# Patient Record
Sex: Female | Born: 1990 | Race: Black or African American | Hispanic: No | Marital: Single | Smoking: Never smoker
Health system: Southern US, Community
[De-identification: ages and names within clinical notes are randomized; demographics above are authoritative.]

## PROBLEM LIST (undated history)

## (undated) DIAGNOSIS — Z789 Other specified health status: Secondary | ICD-10-CM

## (undated) DIAGNOSIS — D649 Anemia, unspecified: Secondary | ICD-10-CM

## (undated) HISTORY — DX: Other specified health status: Z78.9

---
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
## 2011-03-01 ENCOUNTER — Encounter: Payer: Self-pay | Admitting: Emergency Medicine

## 2011-03-01 ENCOUNTER — Emergency Department (INDEPENDENT_AMBULATORY_CARE_PROVIDER_SITE_OTHER)
Admission: EM | Admit: 2011-03-01 | Discharge: 2011-03-01 | Disposition: A | Discharge status: Home / Self Care | Payer: Medicaid Other | Source: Home / Self Care

## 2011-03-01 DIAGNOSIS — T148XXA Other injury of unspecified body region, initial encounter: Secondary | ICD-10-CM

## 2011-03-01 DIAGNOSIS — W57XXXA Bitten or stung by nonvenomous insect and other nonvenomous arthropods, initial encounter: Secondary | ICD-10-CM

## 2011-03-01 DIAGNOSIS — R0781 Pleurodynia: Secondary | ICD-10-CM

## 2011-03-01 DIAGNOSIS — R079 Chest pain, unspecified: Secondary | ICD-10-CM

## 2011-03-01 DIAGNOSIS — T148 Other injury of unspecified body region: Secondary | ICD-10-CM

## 2011-03-01 MED ORDER — IBUPROFEN 800 MG PO TABS: 800.0000 mg | ORAL_TABLET | Freq: Three times a day (TID) | ORAL | Status: AC

## 2011-03-01 NOTE — ED Notes (Signed)
Multiple complaints.  Left mid back pain onset 2 weeks ago.  Pain with movement.  No known injury.   Patient concerned with bumps on right arm, right thigh, right side, right side of neck.  These areas onset 2 weeks ago, thought to be mosquito bites per patient, itch bad, areas she points to are differently colored than skin, side is somewhat reddish, arm is darker hue.

## 2011-03-01 NOTE — ED Provider Notes (Signed)
History     CSN: [id number]  Arrival date & time 03/01/11  4098   None     Chief Complaint  Patient presents with  . Back Pain    (Consider location/radiation/quality/duration/timing/severity/associated sxs/prior treatment) HPI Comments: Lt mid lateral back pain with twisting movement to Lt only x 2 weeks. Denies trauma. No pain unless does the twisting movment. Took "something" a couple of days ago that her mother gave her, thinks it may have been a laxative as her parents thought it might be gas causing her discomfort. No chest discomfort, cough, dyspnea, abd pain, nausea or vomiting. Pt also states that she has itchy bumps x 2 weeks. She first noticed one on her Lt forearm. Then one on her neck, Lt hip area and Lt thigh. They were red and raised but now are flat and fading. She has not been putting anything on them.   The history is provided by the patient.    History reviewed. No pertinent past medical history.  History reviewed. No pertinent past surgical history.  History reviewed. No pertinent family history.  History  Substance Use Topics  . Smoking status: Never Smoker   . Smokeless tobacco: Not on file  . Alcohol Use: No    OB History    Grav Para Term Preterm Abortions TAB SAB Ect Mult Living                  Review of Systems  Constitutional: Negative for fever and chills.  Respiratory: Negative for cough and shortness of breath.   Cardiovascular: Negative for chest pain.  Gastrointestinal: Negative for nausea, vomiting, abdominal pain and diarrhea.  Musculoskeletal: Positive for back pain.  Skin: Positive for rash.    Allergies  Review of patient's allergies indicates no known allergies.  Home Medications   Current Outpatient Rx  Name Route Sig Dispense Refill  . OVER THE COUNTER MEDICATION       . IBUPROFEN 800 MG PO TABS Oral Take 1 tablet (800 mg total) by mouth 3 (three) times daily. 15 tablet 0    BP 102/60  Pulse 85  Temp(Src) 98.7 F  (37.1 C) (Oral)  Resp 14  SpO2 100%  LMP 02/16/2011  Physical Exam  Nursing note and vitals reviewed. Constitutional: She is oriented to person, place, and time. She appears well-developed and well-nourished. No distress.  Cardiovascular: Normal rate, regular rhythm and normal heart sounds.   Pulmonary/Chest: Effort normal and breath sounds normal. No respiratory distress. She exhibits no tenderness.  Abdominal: Soft. Bowel sounds are normal. She exhibits no distension and no mass. There is no tenderness.  Musculoskeletal:       Thoracic back: She exhibits bony tenderness. She exhibits normal range of motion, no tenderness, no swelling, no edema and no spasm.       Lumbar back: Normal.       Back:  Neurological: She is alert and oriented to person, place, and time.  Skin: Skin is warm and dry. Rash noted. Rash is macular and papular.       Lt superior neck 2 mm mildly erythematous papule, no pustule or vesicle. Lt forearm 1 cm macular brownish hyperpigmented area. Lt superior iliac crest area 1.2 cm brownish hyperpigmented macular area with central nonerythematous nodule 5 mm. Lt anterior mid thigh 4 mm mildly erythematous papule, no pustule or vesicle.   Psychiatric: She has a normal mood and affect.    ED Course  Procedures (including critical care time)  Labs Reviewed -  No data to display No results found.   1. Rib pain   2. Insect bites       MDM  Lt lateral rib pain, pain only with Lt rotation movement. No trauma. Resolving itchy papules - suspect insect bites.         Melody Comas, [place] 03/01/11 [phone]

## 2011-03-06 NOTE — ED Provider Notes (Signed)
Medical screening examination/treatment/procedure(s) were performed by non-physician practitioner and as supervising physician I was immediately available for consultation/collaboration.  Corrie Mckusick, MD 03/06/11 1659

## 2017-12-08 ENCOUNTER — Ambulatory Visit (HOSPITAL_COMMUNITY)
Admission: EM | Admit: 2017-12-08 | Discharge: 2017-12-08 | Disposition: A | Discharge status: Home / Self Care | Payer: Self-pay | Attending: Internal Medicine | Admitting: Internal Medicine

## 2017-12-08 ENCOUNTER — Encounter (HOSPITAL_COMMUNITY): Payer: Self-pay | Admitting: Emergency Medicine

## 2017-12-08 DIAGNOSIS — A749 Chlamydial infection, unspecified: Secondary | ICD-10-CM

## 2017-12-08 DIAGNOSIS — A549 Gonococcal infection, unspecified: Secondary | ICD-10-CM

## 2017-12-08 MED ORDER — AZITHROMYCIN 250 MG PO TABS
ORAL_TABLET | ORAL | Status: AC
  Filled 2017-12-08: qty 4

## 2017-12-08 MED ORDER — CEFTRIAXONE SODIUM 250 MG IJ SOLR
250.0000 mg | Freq: Once | INTRAMUSCULAR | Status: AC
  Administered 2017-12-08: 250 mg via INTRAMUSCULAR

## 2017-12-08 MED ORDER — CEFTRIAXONE SODIUM 250 MG IJ SOLR
INTRAMUSCULAR | Status: AC
  Filled 2017-12-08: qty 250

## 2017-12-08 MED ORDER — STERILE WATER FOR INJECTION IJ SOLN
INTRAMUSCULAR | Status: AC
  Filled 2017-12-08: qty 10

## 2017-12-08 MED ORDER — AZITHROMYCIN 250 MG PO TABS
1000.0000 mg | ORAL_TABLET | Freq: Once | ORAL | Status: AC
  Administered 2017-12-08: 1000 mg via ORAL

## 2017-12-08 NOTE — ED Triage Notes (Signed)
Pt sts tested positive last week for [facility] and here for treatment; pt sts tested at health department in [facility]

## 2017-12-08 NOTE — Discharge Instructions (Signed)
We have treated you today for gonorrhea and chlamydia.  Please withhold from intercourse for the next week. Please use condoms to prevent STD's.

## 2017-12-08 NOTE — ED Provider Notes (Signed)
[facility]    CSN: [id number] Arrival date & time: 12/08/17  1016     History   Chief Complaint Chief Complaint  Patient presents with  . Exposure to STD    HPI Linda Holmes is a 27 y.o. female.   Jil presents with requests for std treatment. States her partner notified her that he tested positive and recommended she get screened. She was tested at the health department in [facility] three days ago and was notified that she was positive for both gonorrhea and chlamydia, therefore she is here for treatment. States has had some vaginal itching. No pelvic pain, fevers. No obvious discharge. No back pain. States did have some spotting which has resolved. LMP 9/9, has not missed a period. Not on birth control. States she has had STD's in the past. Only has the 1 partner, doesn't regularly use condoms.     ROS per HPI.       History reviewed. No pertinent past medical history.  There are no active problems to display for this patient.   History reviewed. No pertinent surgical history.  OB History   None      Home Medications    Prior to Admission medications   Medication Sig Start Date End Date Taking? Authorizing Provider  OVER THE COUNTER MEDICATION      [provider]    Family History History reviewed. No pertinent family history.  Social History Social History   Tobacco Use  . Smoking status: Never Smoker  Substance Use Topics  . Alcohol use: No  . Drug use: Not on file     Allergies   Patient has no known allergies.   Review of Systems Review of Systems   Physical Exam Triage Vital Signs ED Triage Vitals [12/08/17 1059]  Enc Vitals Group     BP 132/81     Pulse Rate 99     Resp 18     Temp 97.9 F (36.6 C)     Temp Source Oral     SpO2 100 %     Weight      Height      Head Circumference      Peak Flow      Pain Score 0     Pain Loc      Pain Edu?      Excl. in GC?    No data  found.  Updated Vital Signs BP 132/81 (BP Location: Right Arm)   Pulse 99   Temp 97.9 F (36.6 C) (Oral)   Resp 18   SpO2 100%   Visual Acuity Right Eye Distance:   Left Eye Distance:   Bilateral Distance:    Right Eye Near:   Left Eye Near:    Bilateral Near:     Physical Exam  Constitutional: She is oriented to person, place, and time. She appears well-developed and well-nourished. No distress.  Cardiovascular: Normal rate, regular rhythm and normal heart sounds.  Pulmonary/Chest: Effort normal and breath sounds normal.  Abdominal: Soft. There is no tenderness. There is no rigidity, no rebound, no guarding and no CVA tenderness.  Genitourinary:  Genitourinary Comments: Denies sores, lesions, vaginal bleeding; no pelvic pain; gu exam deferred at this time  Neurological: She is alert and oriented to person, place, and time.  Skin: Skin is warm and dry.     UC Treatments / Results  Labs (all labs ordered are listed, but only abnormal results are displayed) Labs Reviewed -  No data to display  EKG None  Radiology No results found.  Procedures Procedures (including critical care time)  Medications Ordered in UC Medications  azithromycin (ZITHROMAX) tablet 1,000 mg (has no administration in time range)  cefTRIAXone (ROCEPHIN) injection 250 mg (has no administration in time range)    Initial Impression / Assessment and Plan / UC Course  I have reviewed the triage vital signs and the nursing notes.  Pertinent labs & imaging results that were available during my care of the patient were reviewed by me and considered in my medical decision making (see chart for details).     Per patient has already been screened at the health department and was just notified of positive results. Treatment provided today with zithromax and rocephin. No further testing completed. Patient without symptoms. Has not missed period. Encouraged safe sex practices. Patient verbalized  understanding and agreeable to plan.   Final Clinical Impressions(s) / UC Diagnoses   Final diagnoses:  Gonorrhea  Chlamydia     Discharge Instructions     We have treated you today for gonorrhea and chlamydia.  Please withhold from intercourse for the next week. Please use condoms to prevent STD's.     ED Prescriptions    None     Controlled Substance Prescriptions [place] Controlled Substance Registry consulted? Not Applicable   Georgetta Haber, NP 12/08/17 1131

## 2018-03-11 DIAGNOSIS — Z3201 Encounter for pregnancy test, result positive: Secondary | ICD-10-CM | POA: Diagnosis not present

## 2018-03-11 DIAGNOSIS — Z3202 Encounter for pregnancy test, result negative: Secondary | ICD-10-CM | POA: Diagnosis not present

## 2018-03-11 DIAGNOSIS — R350 Frequency of micturition: Secondary | ICD-10-CM | POA: Diagnosis not present

## 2018-03-25 DIAGNOSIS — Z3201 Encounter for pregnancy test, result positive: Secondary | ICD-10-CM | POA: Diagnosis not present

## 2018-04-10 DIAGNOSIS — Z3201 Encounter for pregnancy test, result positive: Secondary | ICD-10-CM | POA: Diagnosis not present

## 2018-04-10 LAB — OB RESULTS CONSOLE VARICELLA ZOSTER ANTIBODY, IGG: Varicella: IMMUNE

## 2018-04-10 LAB — OB RESULTS CONSOLE HEPATITIS B SURFACE ANTIGEN: Hepatitis B Surface Ag: NEGATIVE

## 2018-04-10 LAB — OB RESULTS CONSOLE RUBELLA ANTIBODY, IGM: Rubella: IMMUNE

## 2018-04-15 DIAGNOSIS — Z3A08 8 weeks gestation of pregnancy: Secondary | ICD-10-CM | POA: Diagnosis not present

## 2018-04-15 DIAGNOSIS — Z3401 Encounter for supervision of normal first pregnancy, first trimester: Secondary | ICD-10-CM | POA: Diagnosis not present

## 2018-04-15 DIAGNOSIS — Z23 Encounter for immunization: Secondary | ICD-10-CM | POA: Diagnosis not present

## 2018-05-13 DIAGNOSIS — Z3401 Encounter for supervision of normal first pregnancy, first trimester: Secondary | ICD-10-CM | POA: Diagnosis not present

## 2018-05-13 DIAGNOSIS — Z3A12 12 weeks gestation of pregnancy: Secondary | ICD-10-CM | POA: Diagnosis not present

## 2018-05-21 DIAGNOSIS — Z3401 Encounter for supervision of normal first pregnancy, first trimester: Secondary | ICD-10-CM | POA: Diagnosis not present

## 2018-06-24 DIAGNOSIS — Z3A18 18 weeks gestation of pregnancy: Secondary | ICD-10-CM | POA: Diagnosis not present

## 2018-06-24 DIAGNOSIS — O99012 Anemia complicating pregnancy, second trimester: Secondary | ICD-10-CM | POA: Diagnosis not present

## 2018-06-24 DIAGNOSIS — Z3402 Encounter for supervision of normal first pregnancy, second trimester: Secondary | ICD-10-CM | POA: Diagnosis not present

## 2018-07-30 DIAGNOSIS — Z3A23 23 weeks gestation of pregnancy: Secondary | ICD-10-CM | POA: Diagnosis not present

## 2018-07-30 DIAGNOSIS — Z3402 Encounter for supervision of normal first pregnancy, second trimester: Secondary | ICD-10-CM | POA: Diagnosis not present

## 2018-08-22 DIAGNOSIS — Z3A27 27 weeks gestation of pregnancy: Secondary | ICD-10-CM | POA: Diagnosis not present

## 2018-08-22 DIAGNOSIS — Z3402 Encounter for supervision of normal first pregnancy, second trimester: Secondary | ICD-10-CM | POA: Diagnosis not present

## 2018-08-23 DIAGNOSIS — R05 Cough: Secondary | ICD-10-CM | POA: Diagnosis not present

## 2018-08-23 DIAGNOSIS — Z20828 Contact with and (suspected) exposure to other viral communicable diseases: Secondary | ICD-10-CM | POA: Diagnosis not present

## 2018-08-29 DIAGNOSIS — Z3A28 28 weeks gestation of pregnancy: Secondary | ICD-10-CM | POA: Diagnosis not present

## 2018-08-29 DIAGNOSIS — Z113 Encounter for screening for infections with a predominantly sexual mode of transmission: Secondary | ICD-10-CM | POA: Diagnosis not present

## 2018-08-29 DIAGNOSIS — Z3403 Encounter for supervision of normal first pregnancy, third trimester: Secondary | ICD-10-CM | POA: Diagnosis not present

## 2018-08-29 DIAGNOSIS — Z23 Encounter for immunization: Secondary | ICD-10-CM | POA: Diagnosis not present

## 2018-08-29 LAB — OB RESULTS CONSOLE HIV ANTIBODY (ROUTINE TESTING): HIV: NONREACTIVE

## 2018-09-12 DIAGNOSIS — Z3A30 30 weeks gestation of pregnancy: Secondary | ICD-10-CM | POA: Diagnosis not present

## 2018-09-12 DIAGNOSIS — Z3403 Encounter for supervision of normal first pregnancy, third trimester: Secondary | ICD-10-CM | POA: Diagnosis not present

## 2018-09-26 DIAGNOSIS — Z3403 Encounter for supervision of normal first pregnancy, third trimester: Secondary | ICD-10-CM | POA: Diagnosis not present

## 2018-09-26 DIAGNOSIS — Z3A32 32 weeks gestation of pregnancy: Secondary | ICD-10-CM | POA: Diagnosis not present

## 2018-10-03 DIAGNOSIS — O2693 Pregnancy related conditions, unspecified, third trimester: Secondary | ICD-10-CM | POA: Diagnosis not present

## 2018-10-03 DIAGNOSIS — R51 Headache: Secondary | ICD-10-CM | POA: Diagnosis not present

## 2018-10-03 DIAGNOSIS — Z3A33 33 weeks gestation of pregnancy: Secondary | ICD-10-CM | POA: Diagnosis not present

## 2018-10-14 DIAGNOSIS — O09893 Supervision of other high risk pregnancies, third trimester: Secondary | ICD-10-CM | POA: Diagnosis not present

## 2018-10-14 DIAGNOSIS — Z3A34 34 weeks gestation of pregnancy: Secondary | ICD-10-CM | POA: Diagnosis not present

## 2018-10-14 DIAGNOSIS — O0993 Supervision of high risk pregnancy, unspecified, third trimester: Secondary | ICD-10-CM | POA: Diagnosis not present

## 2018-10-14 DIAGNOSIS — O99013 Anemia complicating pregnancy, third trimester: Secondary | ICD-10-CM | POA: Diagnosis not present

## 2018-10-17 DIAGNOSIS — Z3403 Encounter for supervision of normal first pregnancy, third trimester: Secondary | ICD-10-CM | POA: Diagnosis not present

## 2018-10-17 DIAGNOSIS — Z3A35 35 weeks gestation of pregnancy: Secondary | ICD-10-CM | POA: Diagnosis not present

## 2018-10-24 DIAGNOSIS — Z113 Encounter for screening for infections with a predominantly sexual mode of transmission: Secondary | ICD-10-CM | POA: Diagnosis not present

## 2018-10-24 DIAGNOSIS — Z3403 Encounter for supervision of normal first pregnancy, third trimester: Secondary | ICD-10-CM | POA: Diagnosis not present

## 2018-10-24 DIAGNOSIS — Z3A36 36 weeks gestation of pregnancy: Secondary | ICD-10-CM | POA: Diagnosis not present

## 2018-10-24 LAB — OB RESULTS CONSOLE GC/CHLAMYDIA
Chlamydia: NEGATIVE
Gonorrhea: NEGATIVE

## 2018-10-24 LAB — OB RESULTS CONSOLE GBS: GBS: POSITIVE

## 2018-10-31 DIAGNOSIS — Z3490 Encounter for supervision of normal pregnancy, unspecified, unspecified trimester: Secondary | ICD-10-CM | POA: Diagnosis not present

## 2018-10-31 DIAGNOSIS — Z3A37 37 weeks gestation of pregnancy: Secondary | ICD-10-CM | POA: Diagnosis not present

## 2018-11-04 DIAGNOSIS — O9A213 Injury, poisoning and certain other consequences of external causes complicating pregnancy, third trimester: Secondary | ICD-10-CM | POA: Diagnosis not present

## 2018-11-04 DIAGNOSIS — R1032 Left lower quadrant pain: Secondary | ICD-10-CM | POA: Diagnosis not present

## 2018-11-04 DIAGNOSIS — Z3A37 37 weeks gestation of pregnancy: Secondary | ICD-10-CM | POA: Diagnosis not present

## 2018-11-04 DIAGNOSIS — O99013 Anemia complicating pregnancy, third trimester: Secondary | ICD-10-CM | POA: Diagnosis not present

## 2018-11-04 DIAGNOSIS — O26893 Other specified pregnancy related conditions, third trimester: Secondary | ICD-10-CM | POA: Diagnosis not present

## 2018-11-04 DIAGNOSIS — Z8619 Personal history of other infectious and parasitic diseases: Secondary | ICD-10-CM | POA: Diagnosis not present

## 2018-11-07 DIAGNOSIS — Z3403 Encounter for supervision of normal first pregnancy, third trimester: Secondary | ICD-10-CM | POA: Diagnosis not present

## 2018-11-07 DIAGNOSIS — Z3A38 38 weeks gestation of pregnancy: Secondary | ICD-10-CM | POA: Diagnosis not present

## 2018-11-12 DIAGNOSIS — Z3403 Encounter for supervision of normal first pregnancy, third trimester: Secondary | ICD-10-CM | POA: Diagnosis not present

## 2018-11-12 DIAGNOSIS — Z3A38 38 weeks gestation of pregnancy: Secondary | ICD-10-CM | POA: Diagnosis not present

## 2018-11-19 DIAGNOSIS — Z3A39 39 weeks gestation of pregnancy: Secondary | ICD-10-CM | POA: Diagnosis not present

## 2018-11-19 DIAGNOSIS — Z3403 Encounter for supervision of normal first pregnancy, third trimester: Secondary | ICD-10-CM | POA: Diagnosis not present

## 2018-11-23 ENCOUNTER — Other Ambulatory Visit: Payer: Self-pay

## 2018-11-23 ENCOUNTER — Encounter (HOSPITAL_COMMUNITY): Payer: Self-pay

## 2018-11-23 ENCOUNTER — Inpatient Hospital Stay (HOSPITAL_COMMUNITY): Payer: Medicaid Other | Admitting: Anesthesiology

## 2018-11-23 ENCOUNTER — Inpatient Hospital Stay (HOSPITAL_COMMUNITY)
Admission: AD | Admit: 2018-11-23 | Discharge: 2018-11-26 | DRG: 788 | Disposition: A | Discharge status: Home / Self Care | Payer: Medicaid Other | Attending: Obstetrics and Gynecology | Admitting: Obstetrics and Gynecology

## 2018-11-23 DIAGNOSIS — O99824 Streptococcus B carrier state complicating childbirth: Secondary | ICD-10-CM | POA: Diagnosis present

## 2018-11-23 DIAGNOSIS — Z3A40 40 weeks gestation of pregnancy: Secondary | ICD-10-CM

## 2018-11-23 DIAGNOSIS — Z20828 Contact with and (suspected) exposure to other viral communicable diseases: Secondary | ICD-10-CM | POA: Diagnosis present

## 2018-11-23 DIAGNOSIS — D563 Thalassemia minor: Secondary | ICD-10-CM | POA: Diagnosis present

## 2018-11-23 DIAGNOSIS — O99214 Obesity complicating childbirth: Secondary | ICD-10-CM | POA: Diagnosis present

## 2018-11-23 DIAGNOSIS — O288 Other abnormal findings on antenatal screening of mother: Secondary | ICD-10-CM | POA: Diagnosis present

## 2018-11-23 DIAGNOSIS — E669 Obesity, unspecified: Secondary | ICD-10-CM | POA: Diagnosis present

## 2018-11-23 DIAGNOSIS — O480 Post-term pregnancy: Secondary | ICD-10-CM | POA: Diagnosis not present

## 2018-11-23 HISTORY — DX: Anemia, unspecified: D64.9

## 2018-11-23 LAB — COMPREHENSIVE METABOLIC PANEL
ALT: 13 U/L (ref 0–44)
AST: 22 U/L (ref 15–41)
Albumin: 2.3 g/dL — ABNORMAL LOW (ref 3.5–5.0)
Alkaline Phosphatase: 179 U/L — ABNORMAL HIGH (ref 38–126)
Anion gap: 13 (ref 5–15)
BUN: 7 mg/dL (ref 6–20)
CO2: 16 mmol/L — ABNORMAL LOW (ref 22–32)
Calcium: 8.6 mg/dL — ABNORMAL LOW (ref 8.9–10.3)
Chloride: 108 mmol/L (ref 98–111)
Creatinine, Ser: 0.75 mg/dL (ref 0.44–1.00)
GFR calc Af Amer: >60 mL/min (ref >60)
GFR calc non Af Amer: >60 mL/min (ref >60)
Glucose, Bld: 88 mg/dL (ref 70–99)
Potassium: 4.5 mmol/L (ref 3.5–5.1)
Sodium: 137 mmol/L (ref 135–145)
Total Bilirubin: 1 mg/dL (ref 0.3–1.2)
Total Protein: 5.8 g/dL — ABNORMAL LOW (ref 6.5–8.1)

## 2018-11-23 LAB — CBC
HCT: 39 % (ref 36.0–46.0)
Hemoglobin: 13 g/dL (ref 12.0–15.0)
MCH: 25.4 pg — ABNORMAL LOW (ref 26.0–34.0)
MCHC: 33.3 g/dL (ref 30.0–36.0)
MCV: 76.3 fL — ABNORMAL LOW (ref 80.0–100.0)
Platelets: 224 10*3/uL (ref 150–400)
RBC: 5.11 MIL/uL (ref 3.87–5.11)
RDW: 14.4 % (ref 11.5–15.5)
WBC: 13.8 10*3/uL — ABNORMAL HIGH (ref 4.0–10.5)
nRBC: 0 % (ref 0.0–0.2)

## 2018-11-23 LAB — PROTEIN / CREATININE RATIO, URINE
Creatinine, Urine: 61.67 mg/dL
Protein Creatinine Ratio: 0.26 mg/mg{Cre} — ABNORMAL HIGH (ref 0.00–0.15)
Total Protein, Urine: 16 mg/dL

## 2018-11-23 LAB — TYPE AND SCREEN
ABO/RH(D): A POS
Antibody Screen: NEGATIVE

## 2018-11-23 LAB — SARS CORONAVIRUS 2 BY RT PCR (HOSPITAL ORDER, PERFORMED IN ~~LOC~~ HOSPITAL LAB): SARS Coronavirus 2: NEGATIVE

## 2018-11-23 LAB — RPR: RPR Ser Ql: NONREACTIVE

## 2018-11-23 LAB — ABO/RH: ABO/RH(D): A POS

## 2018-11-23 MED ORDER — OXYTOCIN 40 UNITS IN NORMAL SALINE INFUSION - SIMPLE MED
1.0000 m[IU]/min | INTRAVENOUS | Status: DC
  Administered 2018-11-23: 23:00:00 2 m[IU]/min via INTRAVENOUS

## 2018-11-23 MED ORDER — EPHEDRINE 5 MG/ML INJ: 10.0000 mg | INTRAVENOUS | Status: DC | PRN

## 2018-11-23 MED ORDER — LACTATED RINGERS IV SOLN: INTRAVENOUS | Status: DC

## 2018-11-23 MED ORDER — OXYTOCIN 40 UNITS IN NORMAL SALINE INFUSION - SIMPLE MED
2.5000 [IU]/h | INTRAVENOUS | Status: DC
  Filled 2018-11-23: qty 1000

## 2018-11-23 MED ORDER — LACTATED RINGERS AMNIOINFUSION
INTRAVENOUS | Status: DC
  Administered 2018-11-23: 23:00:00 via INTRAUTERINE

## 2018-11-23 MED ORDER — OXYTOCIN BOLUS FROM INFUSION: 500.0000 mL | Freq: Once | INTRAVENOUS | Status: DC

## 2018-11-23 MED ORDER — LIDOCAINE-EPINEPHRINE (PF) 2 %-1:200000 IJ SOLN
INTRAMUSCULAR | Status: DC | PRN
  Administered 2018-11-23 (×2): 5 mL via EPIDURAL
  Administered 2018-11-24: 3 mL via EPIDURAL
  Administered 2018-11-24 (×2): 5 mL via EPIDURAL

## 2018-11-23 MED ORDER — OXYCODONE-ACETAMINOPHEN 5-325 MG PO TABS: 1.0000 | ORAL_TABLET | ORAL | Status: DC | PRN

## 2018-11-23 MED ORDER — LIDOCAINE HCL (PF) 1 % IJ SOLN
INTRAMUSCULAR | Status: DC | PRN
  Administered 2018-11-23 (×4): 4 mL via EPIDURAL

## 2018-11-23 MED ORDER — FENTANYL-BUPIVACAINE-NACL 0.5-0.125-0.9 MG/250ML-% EP SOLN
12.0000 mL/h | EPIDURAL | Status: DC | PRN
  Filled 2018-11-23: qty 250

## 2018-11-23 MED ORDER — OXYCODONE-ACETAMINOPHEN 5-325 MG PO TABS: 1.0000 | ORAL_TABLET | Freq: Once | ORAL | Status: DC

## 2018-11-23 MED ORDER — LACTATED RINGERS IV SOLN
500.0000 mL | INTRAVENOUS | Status: DC | PRN
  Administered 2018-11-23 (×4): 500 mL via INTRAVENOUS

## 2018-11-23 MED ORDER — TERBUTALINE SULFATE 1 MG/ML IJ SOLN
0.2500 mg | Freq: Once | INTRAMUSCULAR | Status: AC | PRN
  Administered 2018-11-23: 0.25 mg via SUBCUTANEOUS
  Filled 2018-11-23: qty 1

## 2018-11-23 MED ORDER — ACETAMINOPHEN 325 MG PO TABS: 650.0000 mg | ORAL_TABLET | ORAL | Status: DC | PRN

## 2018-11-23 MED ORDER — LACTATED RINGERS IV SOLN
INTRAVENOUS | Status: DC
  Administered 2018-11-23 (×3): via INTRAVENOUS

## 2018-11-23 MED ORDER — LIDOCAINE HCL (PF) 1 % IJ SOLN: 30.0000 mL | INTRAMUSCULAR | Status: DC | PRN

## 2018-11-23 MED ORDER — SOD CITRATE-CITRIC ACID 500-334 MG/5ML PO SOLN
30.0000 mL | ORAL | Status: DC | PRN
  Administered 2018-11-23 – 2018-11-24 (×2): 30 mL via ORAL
  Filled 2018-11-23 (×2): qty 30

## 2018-11-23 MED ORDER — PHENYLEPHRINE 40 MCG/ML (10ML) SYRINGE FOR IV PUSH (FOR BLOOD PRESSURE SUPPORT): 80.0000 ug | PREFILLED_SYRINGE | INTRAVENOUS | Status: DC | PRN

## 2018-11-23 MED ORDER — PHENYLEPHRINE 40 MCG/ML (10ML) SYRINGE FOR IV PUSH (FOR BLOOD PRESSURE SUPPORT)
80.0000 ug | PREFILLED_SYRINGE | INTRAVENOUS | Status: DC | PRN
  Filled 2018-11-23 (×2): qty 10

## 2018-11-23 MED ORDER — PENICILLIN G 3 MILLION UNITS IVPB - SIMPLE MED
3.0000 10*6.[IU] | INTRAVENOUS | Status: DC
  Administered 2018-11-23 (×3): 3 10*6.[IU] via INTRAVENOUS
  Filled 2018-11-23 (×3): qty 100

## 2018-11-23 MED ORDER — SODIUM CHLORIDE (PF) 0.9 % IJ SOLN
INTRAMUSCULAR | Status: DC | PRN
  Administered 2018-11-23: 12 mL/h via EPIDURAL

## 2018-11-23 MED ORDER — DIPHENHYDRAMINE HCL 50 MG/ML IJ SOLN: 12.5000 mg | INTRAMUSCULAR | Status: DC | PRN

## 2018-11-23 MED ORDER — LACTATED RINGERS IV SOLN
500.0000 mL | Freq: Once | INTRAVENOUS | Status: AC
  Administered 2018-11-23: 12:00:00 500 mL via INTRAVENOUS

## 2018-11-23 MED ORDER — ONDANSETRON HCL 4 MG/2ML IJ SOLN
4.0000 mg | Freq: Four times a day (QID) | INTRAMUSCULAR | Status: DC | PRN
  Administered 2018-11-23 (×2): 4 mg via INTRAVENOUS
  Filled 2018-11-23 (×2): qty 2

## 2018-11-23 MED ORDER — OXYCODONE-ACETAMINOPHEN 5-325 MG PO TABS: 2.0000 | ORAL_TABLET | ORAL | Status: DC | PRN

## 2018-11-23 MED ORDER — OXYCODONE-ACETAMINOPHEN 5-325 MG PO TABS
1.0000 | ORAL_TABLET | Freq: Once | ORAL | Status: AC
  Administered 2018-11-23: 1 via ORAL
  Filled 2018-11-23: qty 1

## 2018-11-23 MED ORDER — SODIUM CHLORIDE 0.9 % IV SOLN
5.0000 10*6.[IU] | Freq: Once | INTRAVENOUS | Status: AC
  Administered 2018-11-23: 5 10*6.[IU] via INTRAVENOUS
  Filled 2018-11-23: qty 5

## 2018-11-23 MED ORDER — LACTATED RINGERS IV SOLN
500.0000 mL | Freq: Once | INTRAVENOUS | Status: AC
  Administered 2018-11-23: 500 mL via INTRAVENOUS

## 2018-11-23 NOTE — Anesthesia Procedure Notes (Signed)
Epidural Patient location during procedure: OB Start time: 11/23/2018 11:48 AM End time: 11/23/2018 11:58 AM  Staffing Anesthesiologist: Josephine Igo, MD Performed: anesthesiologist   Preanesthetic Checklist Completed: patient identified, site marked, surgical consent, pre-op evaluation, timeout performed, IV checked, risks and benefits discussed and monitors and equipment checked  Epidural Patient position: sitting Prep: site prepped and draped and DuraPrep Patient monitoring: continuous pulse ox and blood pressure Approach: midline Location: L3-L4 Injection technique: LOR air  Needle:  Needle type: Tuohy  Needle gauge: 17 G Needle length: 9 cm and 9 Needle insertion depth: 8 cm Catheter type: closed end flexible Catheter size: 19 Gauge Catheter at skin depth: 13 cm Test dose: negative and Other  Assessment Events: blood not aspirated, injection not painful, no injection resistance, negative IV test and no paresthesia  Additional Notes Patient identified. Risks and benefits discussed including failed block, incomplete  Pain control, post dural puncture headache, nerve damage, paralysis, blood pressure Changes, nausea, vomiting, reactions to medications-both toxic and allergic and post Partum back pain. All questions were answered. Patient expressed understanding and wished to proceed. Sterile technique was used throughout procedure. Epidural site was Dressed with sterile barrier dressing. No paresthesias, signs of intravascular injection Or signs of intrathecal spread were encountered.  Patient was more comfortable after the epidural was dosed. Please see RN's note for documentation of vital signs and FHR which are stable. Reason for block:procedure for pain

## 2018-11-23 NOTE — Progress Notes (Signed)
Linda Holmes is a 28 y.o. G1P0 at [redacted]w[redacted]d by admitted for SOL.  Subjective: Pt comfortable with epidural.   Objective: BP 114/77   Pulse 85   Temp 99.4 F (37.4 C) (Axillary)   Resp 15   Ht 5\' 3"  (1.6 m)   Wt 96.6 kg   SpO2 99%   BMI 37.73 kg/m  I/O last 3 completed shifts: In: 1809.6 [P.O.:360; I.V.:1199.6; IV Piggyback:250] Out: 1600 [Urine:1600] Total I/O In: -  Out: 326 [Urine:325; Emesis/NG output:1]  FHT:  FHR: 145-150 bpm, variability: moderate,  accelerations:  absent,  decelerations: variable and late decelerations UC:   3-4 SVE:   Dilation: 7 Effacement (%): 60 Station: -2 Exam by: Etheridge Geil   Labs: Lab Results  Component Value Date   WBC 13.8 (H) 11/23/2018   HGB 13.0 11/23/2018   HCT 39.0 11/23/2018   MCV 76.3 (L) 11/23/2018   PLT 224 11/23/2018    Assessment / Plan:  Labor: S/p AROM 1859. Narrow pelvic arch noted on my exam as well; will continue to watch for fetal descent. No cervical change since AROM. IUPC placed at last check and amnioinfusion given. Pit started at 2323 with recurrent variables and late decelerations. Terb given. FSE placed at this check. No cervical change noted. Will plan to restart Pit after 15-20 minutes if able. Discussed possibility of cesarean delivery if unable to augment labor. D/w Dr. Elly Modena.  Preeclampsia:  Pre-E labs WNL Fetal Wellbeing:  Category II Pain Control:  Epidural I/D:  GBS positive on PCN Anticipated MOD:  NSVD  Hazely Sealey N Domenic Schoenberger 11/23/2018, 11:54 PM

## 2018-11-23 NOTE — Progress Notes (Addendum)
Linda Holmes is a 28 y.o. G1P0 at [redacted]w[redacted]d by admitted for active labor  Subjective: Pt comfortable with epidural. S/O in room for support.  Objective: BP 131/60   Pulse 69   Temp 98 F (36.7 C) (Oral)   Resp 18   Ht 5\' 3"  (1.6 m)   Wt 96.6 kg   SpO2 98%   BMI 37.73 kg/m  No intake/output data recorded. Total I/O In: 1809.6 [P.O.:360; I.V.:1199.6; IV Piggyback:250] Out: 1200 [Urine:1200]  FHT:  FHR: 140 bpm, variability: moderate,  accelerations:  Abscent,  decelerations:  Present early, variable UC:   3-4 SVE:   Dilation: 7 Effacement (%): 60 Station: -2 Exam by:: Lattie Haw, CNM  BBOW seen outside introitus.  AROM with light meconium stained fluid.    Labs: Lab Results  Component Value Date   WBC 13.8 (H) 11/23/2018   HGB 13.0 11/23/2018   HCT 39.0 11/23/2018   MCV 76.3 (L) 11/23/2018   PLT 224 11/23/2018    Assessment / Plan: Spontaneous labor, progressing normally  Labor: Labor progressing but fetal station remains high, pt with narrow pubic arch.  With normal progress of labor will continue to watch for fetal descent after AROM.  Preeclampsia:  n/a Fetal Wellbeing:  Category II Pain Control:  Epidural I/D:  GBS positive on PCN Anticipated MOD:  NSVD  Fatima Blank 11/23/2018, 4:53 PM

## 2018-11-23 NOTE — Anesthesia Procedure Notes (Signed)
Epidural Patient location during procedure: OB Start time: 11/23/2018 9:22 PM End time: 11/23/2018 9:32 PM  Staffing Anesthesiologist: Josephine Igo, MD Performed: anesthesiologist   Preanesthetic Checklist Completed: patient identified, site marked, surgical consent, pre-op evaluation, timeout performed, IV checked, risks and benefits discussed and monitors and equipment checked  Epidural Patient position: sitting Prep: site prepped and draped and DuraPrep Patient monitoring: continuous pulse ox and blood pressure Approach: midline Location: L3-L4 Injection technique: LOR air  Needle:  Needle type: Tuohy  Needle gauge: 17 G Needle length: 9 cm and 9 Needle insertion depth: 8 cm Catheter type: closed end flexible Catheter size: 19 Gauge Catheter at skin depth: 13 cm Test dose: negative and Other  Assessment Events: blood not aspirated, injection not painful, no injection resistance, negative IV test and no paresthesia  Additional Notes Patient identified. Risks and benefits discussed including failed block, incomplete  Pain control, post dural puncture headache, nerve damage, paralysis, blood pressure Changes, nausea, vomiting, reactions to medications-both toxic and allergic and post Partum back pain. All questions were answered. Patient expressed understanding and wished to proceed. Sterile technique was used throughout procedure. Epidural site was Dressed with sterile barrier dressing. No paresthesias, signs of intravascular injection Or signs of intrathecal spread were encountered.  Patient was more comfortable after the epidural was dosed. Please see RN's note for documentation of vital signs and FHR which are stable. Reason for block:procedure for pain

## 2018-11-23 NOTE — H&P (Signed)
Linda Holmes is a 28 y.o. female G1P0 at [redacted]w[redacted]d presenting for painful contractions and has intermittent variables on the FHR monitor on arrival. She receives prenatal care at [facility].  Pregnancy has been uncomplicated.  She is feeling normal fetal movement.   OB History    Gravida  1   Para      Term      Preterm      AB      Living        SAB      TAB      Ectopic      Multiple      Live Births             Past Medical History:  Diagnosis Date  . Anemia    History reviewed. No pertinent surgical history. Family History: family history is not on file. Social History:  reports that she has never smoked. She has never used smokeless tobacco. She reports previous drug use. She reports that she does not drink alcohol.     Maternal Diabetes: No Genetic Screening: Normal Maternal Ultrasounds/Referrals: Normal Fetal Ultrasounds or other Referrals:  None Maternal Substance Abuse:  No Significant Maternal Medications:  None Significant Maternal Lab Results:  Group B Strep positive Other Comments:  None  Review of Systems  Constitutional: Negative for chills and fever.  Respiratory: Negative for shortness of breath.   Cardiovascular: Negative for chest pain.  Gastrointestinal: Positive for abdominal pain. Negative for constipation, diarrhea and vomiting.  Neurological: Negative for dizziness and headaches.  All other systems reviewed and are negative.  Maternal Medical History:  Reason for admission: Contractions.  Variable decelerations  Contractions: Onset was 3-5 hours ago.   Frequency: regular.   Perceived severity is moderate.    Fetal activity: Perceived fetal activity is normal.   Last perceived fetal movement was within the past hour.    Prenatal complications: no prenatal complications Prenatal Complications - Diabetes: none.    Dilation: 4 Effacement (%): 50 Station: -3 Exam by:: Jorje Guild NP Blood pressure 131/65, pulse 74,  temperature 98.8 F (37.1 C), temperature source Oral, resp. rate 16, height 5\' 3"  (1.6 m), weight 96.6 kg, SpO2 100 %. Maternal Exam:  Uterine Assessment: Contraction strength is mild.  Contraction frequency is irregular.   Abdomen: Fetal presentation: vertex  Cervix: Cervix evaluated by digital exam.     Fetal Exam Fetal Monitor Review: Mode: ultrasound.   Baseline rate: 135.  Variability: moderate (6-25 bpm).   Pattern: accelerations present and variable decelerations.    Fetal State Assessment: Category I - tracings are normal.     Physical Exam  Nursing note and vitals reviewed. Constitutional: She is oriented to person, place, and time. She appears well-developed and well-nourished.  Neck: Normal range of motion.  Cardiovascular: Normal rate, regular rhythm and normal heart sounds.  Respiratory: Effort normal.  GI: Soft.  Musculoskeletal: Normal range of motion.  Neurological: She is alert and oriented to person, place, and time.  Skin: Skin is warm and dry.  Psychiatric: She has a normal mood and affect. Her behavior is normal. Judgment and thought content normal.    Prenatal labs: ABO, Rh:  A pos Antibody:  neg Rubella:  immune RPR:   neg HBsAg:   neg HIV:   nonreactive GBS: Positive/-- (08/21 0000)   Assessment/Plan: G1P0 @[redacted]w[redacted]d  with early labor and variable decelerations GBS positive  Admit to L&D PCN for GBS prophylaxis Expectant management on admission, consider  augmentation PRN Anticipate NSVD  Sharen CounterLisa Leftwich-Kirby 11/23/2018, 9:06 AM

## 2018-11-23 NOTE — MAU Note (Addendum)
Presents with cxts starting around 0200 today; approx 73min apart. No LOF or vag bldg. Pos FM. GBS pos per pt   Gilmer Mor RN

## 2018-11-23 NOTE — Progress Notes (Addendum)
Linda Holmes is a 28 y.o. G1P0 at [redacted]w[redacted]d by admitted for SOL.  Subjective: Pt comfortable with epidural. Some nausea and vomiting.   Objective: BP 120/74   Pulse 72   Temp 99.4 F (37.4 C) (Axillary)   Resp 16   Ht 5\' 3"  (1.6 m)   Wt 96.6 kg   SpO2 100%   BMI 37.73 kg/m  I/O last 3 completed shifts: In: 1809.6 [P.O.:360; I.V.:1199.6; IV Piggyback:250] Out: 1600 [Urine:1600] Total I/O In: -  Out: 125 [Urine:125]  FHT:  FHR: 145 bpm, variability: moderate,  accelerations:  absent,  decelerations:  Present early, variable UC:   3-4 SVE:   Dilation: 7 Effacement (%): 60 Station: -2 Exam by: Kraig Genis   Labs: Lab Results  Component Value Date   WBC 13.8 (H) 11/23/2018   HGB 13.0 11/23/2018   HCT 39.0 11/23/2018   MCV 76.3 (L) 11/23/2018   PLT 224 11/23/2018    Assessment / Plan: Spontaneous labor, progressing normally  Labor: S/p AROM. Narrow pelvic arch noted on my exam as well; will continue to watch for fetal descent. No change since last SVE. Will start Pit. IUPC placed due to variables and will start amnioinfusion. BSUS done to confirm placenta position prior to IUPC placement as none present in Epic. Anterior placenta. Vertex position. Preeclampsia:  Pre-E labs WNL Fetal Wellbeing:  Category II Pain Control:  Epidural I/D:  GBS positive on PCN Anticipated MOD:  NSVD  Linda Holmes N Linda Holmes 11/23/2018, 10:43 PM

## 2018-11-23 NOTE — Anesthesia Preprocedure Evaluation (Addendum)
Anesthesia Evaluation  Patient identified by MRN, date of birth, ID band Patient awake    Reviewed: Allergy & Precautions, Patient's Chart, lab work & pertinent test results  Airway Mallampati: II  TM Distance: >3 FB Neck ROM: Full    Dental no notable dental hx. (+) Teeth Intact   Pulmonary neg pulmonary ROS,    Pulmonary exam normal breath sounds clear to auscultation       Cardiovascular negative cardio ROS Normal cardiovascular exam Rhythm:Regular Rate:Normal     Neuro/Psych negative neurological ROS  negative psych ROS   GI/Hepatic Neg liver ROS, GERD  ,  Endo/Other  Obesity   Renal/GU negative Renal ROS  negative genitourinary   Musculoskeletal negative musculoskeletal ROS (+)   Abdominal (+) + obese,   Peds  Hematology  (+) anemia ,   Anesthesia Other Findings   Reproductive/Obstetrics (+) Pregnancy                             Anesthesia Physical Anesthesia Plan  ASA: II and emergent  Anesthesia Plan: Epidural   Post-op Pain Management:    Induction:   PONV Risk Score and Plan:   Airway Management Planned: Natural Airway  Additional Equipment:   Intra-op Plan:   Post-operative Plan:   Informed Consent: I have reviewed the patients History and Physical, chart, labs and discussed the procedure including the risks, benefits and alternatives for the proposed anesthesia with the patient or authorized representative who has indicated his/her understanding and acceptance.       Plan Discussed with: Anesthesiologist  Anesthesia Plan Comments: (For C/Section. Will use epidural. M. Royce Macadamia, MD)       Anesthesia Quick Evaluation

## 2018-11-24 ENCOUNTER — Encounter (HOSPITAL_COMMUNITY)
Admission: AD | Disposition: A | Discharge status: Home / Self Care | Payer: Self-pay | Source: Home / Self Care | Attending: Obstetrics and Gynecology

## 2018-11-24 ENCOUNTER — Encounter (HOSPITAL_COMMUNITY): Payer: Self-pay | Admitting: Neonatal-Perinatal Medicine

## 2018-11-24 DIAGNOSIS — Z3A40 40 weeks gestation of pregnancy: Secondary | ICD-10-CM

## 2018-11-24 DIAGNOSIS — O480 Post-term pregnancy: Secondary | ICD-10-CM

## 2018-11-24 DIAGNOSIS — D563 Thalassemia minor: Secondary | ICD-10-CM

## 2018-11-24 LAB — CBC
HCT: 30.7 % — ABNORMAL LOW (ref 36.0–46.0)
Hemoglobin: 10.2 g/dL — ABNORMAL LOW (ref 12.0–15.0)
MCH: 25.6 pg — ABNORMAL LOW (ref 26.0–34.0)
MCHC: 33.2 g/dL (ref 30.0–36.0)
MCV: 77.1 fL — ABNORMAL LOW (ref 80.0–100.0)
Platelets: 196 10*3/uL (ref 150–400)
RBC: 3.98 MIL/uL (ref 3.87–5.11)
RDW: 14.5 % (ref 11.5–15.5)
WBC: 21.9 10*3/uL — ABNORMAL HIGH (ref 4.0–10.5)
nRBC: 0 % (ref 0.0–0.2)

## 2018-11-24 LAB — CREATININE, SERUM
Creatinine, Ser: 0.89 mg/dL (ref 0.44–1.00)
GFR calc Af Amer: >60 mL/min (ref >60)
GFR calc non Af Amer: >60 mL/min (ref >60)

## 2018-11-24 SURGERY — Surgical Case
Anesthesia: Epidural

## 2018-11-24 MED ORDER — SIMETHICONE 80 MG PO CHEW: 80.0000 mg | CHEWABLE_TABLET | ORAL | Status: DC | PRN

## 2018-11-24 MED ORDER — IBUPROFEN 800 MG PO TABS
800.0000 mg | ORAL_TABLET | Freq: Three times a day (TID) | ORAL | Status: DC
  Administered 2018-11-24 – 2018-11-26 (×7): 800 mg via ORAL
  Filled 2018-11-24 (×6): qty 1

## 2018-11-24 MED ORDER — ONDANSETRON HCL 4 MG/2ML IJ SOLN
INTRAMUSCULAR | Status: DC | PRN
  Administered 2018-11-24: 4 mg via INTRAVENOUS

## 2018-11-24 MED ORDER — DIPHENHYDRAMINE HCL 50 MG/ML IJ SOLN: 12.5000 mg | INTRAMUSCULAR | Status: DC | PRN

## 2018-11-24 MED ORDER — SIMETHICONE 80 MG PO CHEW
80.0000 mg | CHEWABLE_TABLET | Freq: Three times a day (TID) | ORAL | Status: DC
  Administered 2018-11-24 – 2018-11-26 (×4): 80 mg via ORAL
  Filled 2018-11-24 (×6): qty 1

## 2018-11-24 MED ORDER — SODIUM CHLORIDE 0.9 % IV SOLN
INTRAVENOUS | Status: DC | PRN
  Administered 2018-11-24: 01:00:00 via INTRAVENOUS

## 2018-11-24 MED ORDER — PHENYLEPHRINE HCL (PRESSORS) 10 MG/ML IV SOLN
INTRAVENOUS | Status: DC | PRN
  Administered 2018-11-24 (×8): 80 ug via INTRAVENOUS

## 2018-11-24 MED ORDER — FAMOTIDINE 20 MG PO TABS
20.0000 mg | ORAL_TABLET | Freq: Two times a day (BID) | ORAL | Status: DC
  Administered 2018-11-24 – 2018-11-26 (×4): 20 mg via ORAL
  Filled 2018-11-24 (×4): qty 1

## 2018-11-24 MED ORDER — COCONUT OIL OIL
1.0000 "application " | TOPICAL_OIL | Status: DC | PRN
  Administered 2018-11-26: 1 via TOPICAL

## 2018-11-24 MED ORDER — SODIUM CHLORIDE 0.9 % IV SOLN
INTRAVENOUS | Status: AC
  Filled 2018-11-24: qty 500

## 2018-11-24 MED ORDER — CEFAZOLIN SODIUM-DEXTROSE 2-4 GM/100ML-% IV SOLN
2.0000 g | INTRAVENOUS | Status: AC
  Administered 2018-11-24: 2 g via INTRAVENOUS

## 2018-11-24 MED ORDER — KETOROLAC TROMETHAMINE 30 MG/ML IJ SOLN: 30.0000 mg | Freq: Four times a day (QID) | INTRAMUSCULAR | Status: AC | PRN

## 2018-11-24 MED ORDER — ONDANSETRON HCL 4 MG/2ML IJ SOLN
INTRAMUSCULAR | Status: AC
  Filled 2018-11-24: qty 2

## 2018-11-24 MED ORDER — LACTATED RINGERS IV SOLN
INTRAVENOUS | Status: DC | PRN
  Administered 2018-11-24 (×2): via INTRAVENOUS

## 2018-11-24 MED ORDER — OXYCODONE HCL 5 MG PO TABS: 5.0000 mg | ORAL_TABLET | ORAL | Status: DC | PRN

## 2018-11-24 MED ORDER — SODIUM CHLORIDE 0.9 % IV SOLN
500.0000 mg | INTRAVENOUS | Status: AC
  Administered 2018-11-24: 01:00:00 500 mg via INTRAVENOUS

## 2018-11-24 MED ORDER — GABAPENTIN 100 MG PO CAPS
100.0000 mg | ORAL_CAPSULE | Freq: Three times a day (TID) | ORAL | Status: DC
  Administered 2018-11-24 – 2018-11-26 (×7): 100 mg via ORAL
  Filled 2018-11-24 (×7): qty 1

## 2018-11-24 MED ORDER — SODIUM CHLORIDE 0.9 % IV BOLUS
500.0000 mL | Freq: Once | INTRAVENOUS | Status: AC
  Administered 2018-11-24: 500 mL via INTRAVENOUS

## 2018-11-24 MED ORDER — ENOXAPARIN SODIUM 40 MG/0.4ML ~~LOC~~ SOLN
40.0000 mg | SUBCUTANEOUS | Status: DC
  Administered 2018-11-24 – 2018-11-25 (×2): 40 mg via SUBCUTANEOUS
  Filled 2018-11-24 (×2): qty 0.4

## 2018-11-24 MED ORDER — KETOROLAC TROMETHAMINE 30 MG/ML IJ SOLN
INTRAMUSCULAR | Status: AC
  Filled 2018-11-24: qty 1

## 2018-11-24 MED ORDER — DIBUCAINE (PERIANAL) 1 % EX OINT: 1.0000 "application " | TOPICAL_OINTMENT | CUTANEOUS | Status: DC | PRN

## 2018-11-24 MED ORDER — CEFAZOLIN SODIUM-DEXTROSE 2-4 GM/100ML-% IV SOLN
INTRAVENOUS | Status: AC
  Filled 2018-11-24: qty 100

## 2018-11-24 MED ORDER — LIDOCAINE 2% (20 MG/ML) 5 ML SYRINGE
INTRAMUSCULAR | Status: AC
  Filled 2018-11-24: qty 5

## 2018-11-24 MED ORDER — NALOXONE HCL 0.4 MG/ML IJ SOLN: 0.4000 mg | INTRAMUSCULAR | Status: DC | PRN

## 2018-11-24 MED ORDER — SODIUM CHLORIDE 0.9% FLUSH: 3.0000 mL | INTRAVENOUS | Status: DC | PRN

## 2018-11-24 MED ORDER — DIPHENHYDRAMINE HCL 25 MG PO CAPS: 25.0000 mg | ORAL_CAPSULE | ORAL | Status: DC | PRN

## 2018-11-24 MED ORDER — MEPERIDINE HCL 25 MG/ML IJ SOLN: 6.2500 mg | INTRAMUSCULAR | Status: DC | PRN

## 2018-11-24 MED ORDER — SODIUM CHLORIDE 0.9 % IV BOLUS
1000.0000 mL | Freq: Once | INTRAVENOUS | Status: AC
  Administered 2018-11-24: 14:00:00 1000 mL via INTRAVENOUS

## 2018-11-24 MED ORDER — FUROSEMIDE 10 MG/ML IJ SOLN
10.0000 mg | Freq: Once | INTRAMUSCULAR | Status: AC
  Administered 2018-11-24: 10 mg via INTRAVENOUS
  Filled 2018-11-24: qty 2

## 2018-11-24 MED ORDER — KETOROLAC TROMETHAMINE 30 MG/ML IJ SOLN
30.0000 mg | Freq: Four times a day (QID) | INTRAMUSCULAR | Status: AC | PRN
  Administered 2018-11-24: 30 mg via INTRAMUSCULAR

## 2018-11-24 MED ORDER — PHENYLEPHRINE 40 MCG/ML (10ML) SYRINGE FOR IV PUSH (FOR BLOOD PRESSURE SUPPORT)
PREFILLED_SYRINGE | INTRAVENOUS | Status: AC
  Filled 2018-11-24: qty 20

## 2018-11-24 MED ORDER — SENNOSIDES-DOCUSATE SODIUM 8.6-50 MG PO TABS
2.0000 | ORAL_TABLET | ORAL | Status: DC
  Administered 2018-11-25 (×2): 2 via ORAL
  Filled 2018-11-24 (×2): qty 2

## 2018-11-24 MED ORDER — ONDANSETRON HCL 4 MG/2ML IJ SOLN
4.0000 mg | Freq: Three times a day (TID) | INTRAMUSCULAR | Status: DC | PRN
  Administered 2018-11-24: 4 mg via INTRAVENOUS
  Filled 2018-11-24: qty 2

## 2018-11-24 MED ORDER — FENTANYL CITRATE (PF) 100 MCG/2ML IJ SOLN: 25.0000 ug | INTRAMUSCULAR | Status: DC | PRN

## 2018-11-24 MED ORDER — SODIUM CHLORIDE 0.9 % IR SOLN
Status: DC | PRN
  Administered 2018-11-24: 500 mL

## 2018-11-24 MED ORDER — MENTHOL 3 MG MT LOZG: 1.0000 | LOZENGE | OROMUCOSAL | Status: DC | PRN

## 2018-11-24 MED ORDER — TETANUS-DIPHTH-ACELL PERTUSSIS 5-2.5-18.5 LF-MCG/0.5 IM SUSP: 0.5000 mL | Freq: Once | INTRAMUSCULAR | Status: DC

## 2018-11-24 MED ORDER — DIPHENHYDRAMINE HCL 25 MG PO CAPS: 25.0000 mg | ORAL_CAPSULE | Freq: Four times a day (QID) | ORAL | Status: DC | PRN

## 2018-11-24 MED ORDER — MORPHINE SULFATE (PF) 0.5 MG/ML IJ SOLN
INTRAMUSCULAR | Status: DC | PRN
  Administered 2018-11-24: 3 mg via EPIDURAL

## 2018-11-24 MED ORDER — WITCH HAZEL-GLYCERIN EX PADS: 1.0000 "application " | MEDICATED_PAD | CUTANEOUS | Status: DC | PRN

## 2018-11-24 MED ORDER — OXYTOCIN 40 UNITS IN NORMAL SALINE INFUSION - SIMPLE MED: 1.0000 m[IU]/min | INTRAVENOUS | Status: DC

## 2018-11-24 MED ORDER — NALOXONE HCL 4 MG/10ML IJ SOLN
1.0000 ug/kg/h | INTRAVENOUS | Status: DC | PRN
  Filled 2018-11-24: qty 5

## 2018-11-24 MED ORDER — PRENATAL MULTIVITAMIN CH
1.0000 | ORAL_TABLET | Freq: Every day | ORAL | Status: DC
  Administered 2018-11-24 – 2018-11-26 (×3): 1 via ORAL
  Filled 2018-11-24 (×3): qty 1

## 2018-11-24 MED ORDER — MORPHINE SULFATE (PF) 0.5 MG/ML IJ SOLN
INTRAMUSCULAR | Status: AC
  Filled 2018-11-24: qty 10

## 2018-11-24 MED ORDER — LACTATED RINGERS IV SOLN
INTRAVENOUS | Status: DC
  Administered 2018-11-24 (×2): via INTRAVENOUS

## 2018-11-24 MED ORDER — TERBUTALINE SULFATE 1 MG/ML IJ SOLN
INTRAMUSCULAR | Status: AC
  Administered 2018-11-24: 1 mg via SUBCUTANEOUS
  Filled 2018-11-24: qty 1

## 2018-11-24 MED ORDER — DEXAMETHASONE SODIUM PHOSPHATE 4 MG/ML IJ SOLN
INTRAMUSCULAR | Status: AC
  Filled 2018-11-24: qty 1

## 2018-11-24 MED ORDER — OXYTOCIN 40 UNITS IN NORMAL SALINE INFUSION - SIMPLE MED
2.5000 [IU]/h | INTRAVENOUS | Status: AC
  Administered 2018-11-24: 04:00:00 2.5 [IU]/h via INTRAVENOUS

## 2018-11-24 MED ORDER — DEXAMETHASONE SODIUM PHOSPHATE 4 MG/ML IJ SOLN
INTRAMUSCULAR | Status: DC | PRN
  Administered 2018-11-24: 4 mg via INTRAVENOUS

## 2018-11-24 MED ORDER — SODIUM CHLORIDE 0.9 % IR SOLN
Status: DC | PRN
  Administered 2018-11-24: 1000 mL

## 2018-11-24 MED ORDER — SIMETHICONE 80 MG PO CHEW
80.0000 mg | CHEWABLE_TABLET | ORAL | Status: DC
  Administered 2018-11-25 (×2): 80 mg via ORAL
  Filled 2018-11-24 (×2): qty 1

## 2018-11-24 SURGICAL SUPPLY — 34 items
CHLORAPREP W/TINT 26ML (MISCELLANEOUS) ×3 IMPLANT
CLAMP CORD UMBIL (MISCELLANEOUS) IMPLANT
DRSG OPSITE POSTOP 4X10 (GAUZE/BANDAGES/DRESSINGS) ×3 IMPLANT
ELECT REM PT RETURN 9FT ADLT (ELECTROSURGICAL) ×3
ELECTRODE REM PT RTRN 9FT ADLT (ELECTROSURGICAL) ×1 IMPLANT
EXTRACTOR VACUUM M CUP 4 TUBE (SUCTIONS) IMPLANT
EXTRACTOR VACUUM M CUP 4' TUBE (SUCTIONS)
GLOVE BIOGEL PI IND STRL 6.5 (GLOVE) ×1 IMPLANT
GLOVE BIOGEL PI IND STRL 7.0 (GLOVE) ×1 IMPLANT
GLOVE BIOGEL PI INDICATOR 6.5 (GLOVE) ×2
GLOVE BIOGEL PI INDICATOR 7.0 (GLOVE) ×2
GLOVE SURG SS PI 6.0 STRL IVOR (GLOVE) ×3 IMPLANT
GOWN STRL REUS W/TWL LRG LVL3 (GOWN DISPOSABLE) ×6 IMPLANT
HOVERMATT SINGLE USE (MISCELLANEOUS) ×2 IMPLANT
KIT ABG SYR 3ML LUER SLIP (SYRINGE) IMPLANT
NDL HYPO 25X5/8 SAFETYGLIDE (NEEDLE) IMPLANT
NEEDLE HYPO 25X5/8 SAFETYGLIDE (NEEDLE) IMPLANT
NS IRRIG 1000ML POUR BTL (IV SOLUTION) ×3 IMPLANT
PACK C SECTION WH (CUSTOM PROCEDURE TRAY) ×3 IMPLANT
PAD ABD 8X10 STRL (GAUZE/BANDAGES/DRESSINGS) ×2 IMPLANT
PAD OB MATERNITY 4.3X12.25 (PERSONAL CARE ITEMS) ×3 IMPLANT
PENCIL SMOKE EVAC W/HOLSTER (ELECTROSURGICAL) ×3 IMPLANT
RETRACTOR WND ALEXIS 25 LRG (MISCELLANEOUS) IMPLANT
RTRCTR C-SECT PINK 25CM LRG (MISCELLANEOUS) IMPLANT
RTRCTR WOUND ALEXIS 25CM LRG (MISCELLANEOUS) ×3
SEPRAFILM MEMBRANE 5X6 (MISCELLANEOUS) IMPLANT
SPONGE GAUZE 4X4 12PLY STER LF (GAUZE/BANDAGES/DRESSINGS) ×4 IMPLANT
SUT PLAIN 0 NONE (SUTURE) IMPLANT
SUT VIC AB 0 CT1 36 (SUTURE) ×14 IMPLANT
SUT VIC AB 4-0 KS 27 (SUTURE) ×3 IMPLANT
TAPE CLOTH SURG 6X10 WHT LF (GAUZE/BANDAGES/DRESSINGS) ×2 IMPLANT
TOWEL OR 17X24 6PK STRL BLUE (TOWEL DISPOSABLE) ×3 IMPLANT
TRAY FOLEY W/BAG SLVR 14FR LF (SET/KITS/TRAYS/PACK) ×3 IMPLANT
WATER STERILE IRR 1000ML POUR (IV SOLUTION) ×3 IMPLANT

## 2018-11-24 NOTE — Progress Notes (Signed)
Unable to start Pitocin due to recurrent variables and late decelerations. Cervical exam unchanged. Will proceed with Cesarean section due to failure to progress and fetal intolerance of labor. The risks of cesarean section were discussed with the patient including but were not limited to: bleeding which may require transfusion or reoperation; infection which may require antibiotics; injury to bowel, bladder, ureters or other surrounding organs; injury to the fetus; need for additional procedures including hysterectomy in the event of a life-threatening hemorrhage; placental abnormalities wth subsequent pregnancies, incisional problems, thromboembolic phenomenon and other postoperative/anesthesia complications.  Patient has been NPO since 1153 on 9/20 she will remain NPO for procedure. Anesthesia and OR aware.  Preoperative prophylactic antibiotics and SCDs ordered on call to the OR.  To OR when ready.  Barrington Ellison, MD [facility] Fellow, [facility] for [employer], [place]

## 2018-11-24 NOTE — Progress Notes (Signed)
Patient screened out for psychosocial assessment since none of the following apply: °Psychosocial stressors documented in mother or baby's chart °Gestation less than 32 weeks °Code at delivery  °Infant with anomalies °Please contact the Clinical Social Worker if specific needs arise, by MOB's request, or if MOB scores greater than 9/yes to question 10 on Edinburgh Postpartum Depression Screen. ° °Ismerai Bin Boyd-Gilyard, MSW, LCSW °Clinical Social Work °[phone] °  °

## 2018-11-24 NOTE — Discharge Summary (Signed)
Postpartum Discharge Summary  Date of Service updated 11/26/2018    Patient Name: Linda Holmes DOB: 10/23/1990 MRN: [id number]  Date of admission: 11/23/2018 Delivering Provider: CONSTANT, PEGGY   Date of discharge: 11/26/2018  Admitting diagnosis: 40 wks CTX Intrauterine pregnancy: [redacted]w[redacted]d    Secondary diagnosis:  Principal Problem:   [redacted] weeks gestation of pregnancy Active Problems:   NST (non-stress test) nonreactive   Thalassemia trait  Additional problems: None     Discharge diagnosis: Term Pregnancy Delivered                                                                                                Post partum procedures: None  Augmentation: AROM and Pitocin  Complications: None  Hospital course:  Onset of Labor With Unplanned C/S  28y.o. yo G1P0 at 455w3das admitted in Latent Labor on 11/23/2018. Initial SVE 4/50/-3. Patient continued to progress on her own. AROM performed at 18EthelPatient did not make cervical change thereafter and noted to have repetitive variables and late decelerations. IUPC with amnioinfusion done. Attempted to start Pitocin with continued repetitive late decels and variables despite terbutaline twice. Decision made to proceed with cesarean section due to fetal intolerance of labor, failure to progress and NRFHT. Membrane Rupture Time/Date: 6:59 PM ,11/23/2018   The patient went for cesarean section due to Arrest of Dilation, Arrest of Descent and Non-Reassuring FHR, and delivered a Viable infant,11/24/2018  Details of operation can be found in separate operative note. Patient had an uncomplicated postpartum course.  She is ambulating,tolerating a regular diet, passing flatus, and urinating well.  Patient is discharged home in stable condition 11/26/18. Delivery time: 1:19 AM    Magnesium Sulfate received: No BMZ received: No Rhophylac:No MMR:No Transfusion:No  Physical exam  Vitals:   11/25/18 1227 11/25/18 1756 11/25/18 1952 11/26/18 0345   BP: 129/72 (!) 141/73 128/67 132/78  Pulse: (!) 105 92 96 90  Resp: 17 16 18 18   Temp: 97.6 F (36.4 C) 98.2 F (36.8 C) 98.3 F (36.8 C) 97.6 F (36.4 C)  TempSrc: Oral Oral Oral Oral  SpO2: 100% 99% 98% 99%  Weight:      Height:       General: alert Lochia: appropriate Uterine Fundus: firm Incision: Healing well with no significant drainage DVT Evaluation: No evidence of DVT seen on physical exam. Labs: Lab Results  Component Value Date   WBC 21.9 (H) 11/24/2018   HGB 10.2 (L) 11/24/2018   HCT 30.7 (L) 11/24/2018   MCV 77.1 (L) 11/24/2018   PLT 196 11/24/2018   CMP Latest Ref Rng & Units 11/24/2018  Glucose 70 - 99 mg/dL -  BUN 6 - 20 mg/dL -  Creatinine 0.44 - 1.00 mg/dL 0.89  Sodium 135 - 145 mmol/L -  Potassium 3.5 - 5.1 mmol/L -  Chloride 98 - 111 mmol/L -  CO2 22 - 32 mmol/L -  Calcium 8.9 - 10.3 mg/dL -  Total Protein 6.5 - 8.1 g/dL -  Total Bilirubin 0.3 - 1.2 mg/dL -  Alkaline Phos 38 - 126 U/L -  AST 15 - 41 U/L -  ALT 0 - 44 U/L -    Discharge instruction: per After Visit Summary and "Baby and Me Booklet".  After visit meds:  Allergies as of 11/26/2018   No Known Allergies     Medication List    TAKE these medications   ferrous sulfate 325 (65 FE) MG EC tablet Take 325 mg by mouth. Once daily   ibuprofen 800 MG tablet Commonly known as: ADVIL Take 1 tablet (800 mg total) by mouth every 8 (eight) hours.   multivitamin-prenatal 27-0.8 MG Tabs tablet Take 1 tablet by mouth daily at 12 noon.   oxyCODONE 5 MG immediate release tablet Commonly known as: Oxy IR/ROXICODONE Take 1 tablet (5 mg total) by mouth every 4 (four) hours as needed for moderate pain.            Discharge Care Instructions  (From admission, onward)         Start     Ordered   11/26/18 0000  Discharge wound care:    Comments: Remove honeycomb dressing on Sunday   11/26/18 0944          Diet: routine diet  Activity: Advance as tolerated. Pelvic rest for 6  weeks.   Outpatient follow up:4 weeks Follow up Appt: Future Appointments  Date Time Provider [place]  12/09/2018  1:30 PM Parma Savage  12/23/2018  3:35 PM Burleson, Rona Ravens, NP [facility]   Follow up Visit: [place] [facility] Follow up in 2 week(s).   Specialty: Obstetrics and Gynecology Why: 2 weeks for incision check Contact information: [address] [place], [place] [id number] [place] [address] [phone]           Please schedule this patient for Postpartum visit in: 4 weeks with the following provider: Any provider For C/S patients schedule nurse incision check in weeks 2 weeks: yes Low risk pregnancy complicated by: None. Patient with outside prenatal care and unable to obtain records prior to delivery. Delivery mode:  CS Anticipated Birth Control:  Nexplanon PP Procedures needed: Incision check  Schedule Integrated BH visit: no      Newborn Data: See NICU records  Newborn Delivery   Birth date/time: 11/24/2018 01:19:00 Delivery type: C-Section, Low Transverse Trial of labor: Yes C-section categorization: Primary      Baby Feeding: Breast Disposition:home with mother   11/26/2018 Chancy Milroy, MD

## 2018-11-24 NOTE — Progress Notes (Signed)
Pt's UO from 7:00am - 14:15pm was 172ml.  Urine was amber/pink tinged and cloudy.  Not malodorous.  MD called and 1 liter bolus of NS and 10 mg Lasix ordered and both were given.  1 hour after lasix dose given pt's output 275 ml.  Lighter amber and still cloudy.  Pt c/o heartburn and requests medication.  Phoned Dr. Rosana Hoes with above information.  See orders for IVF bolus and Pepcid.

## 2018-11-24 NOTE — Lactation Note (Signed)
This note was copied from a baby's chart. Lactation Consultation Note  Patient Name: Linda Holmes VEHMC'N Date: 11/24/2018 Reason for consult: Initial assessment;Term;Primapara;1st time breastfeeding;NICU baby  P1 mother whose infant is now 11 hours old.  This is a term infant weighing < 6 lbs and in the NICU.  Mother had recently returned from visiting baby in the NICU.  She gave me an update on his situation and has attempted breast feeding one time so far.  She stated that he will be feeding again at 1730.  Mother encouraged to pump every three hours and to include hand expression before/after pumping to increase milk supply.  Reviewed hand expression with mother and she was able to express colostrum drops.  Colostrum container provided and milk storage times reviewed.  Mother will take all EBM to NICU.  She has labels and containers at bedside and knows to ask RN for more labels and bottles as needed.    Reviewed milk storage times in the booklet, "Providing Breast Milk for Your Baby in the NICU."  Mom made aware of O/P services, breastfeeding support groups, community resources, and our phone # for post-discharge questions.   Mother has a DEBP for home use.  Encouraged her to ask her NICU RN for assistance with latching as needed and that she may also call LC as needed for guidance and assistance.  Informed mother that she is able to take her Medela pump parts with her to the NICU and pump at baby's bedside.  Mother may do this.     Maternal Data Formula Feeding for Exclusion: No Has patient been taught Hand Expression?: Yes Does the patient have breastfeeding experience prior to this delivery?: No  Feeding Feeding Type: Breast Fed  LATCH Score Latch: Repeated attempts needed to sustain latch, nipple held in mouth throughout feeding, stimulation needed to elicit sucking reflex.  Audible Swallowing: A few with stimulation  Type of Nipple: Flat  Comfort (Breast/Nipple): Soft /  non-tender  Hold (Positioning): Assistance needed to correctly position infant at breast and maintain latch.  LATCH Score: 6  Interventions Interventions: Assisted with latch;Skin to skin;Hand express;Support pillows;Adjust position;Breast compression  Lactation Tools Discussed/Used Pump Review: (Mother needed no review of pump set up)   Consult Status Consult Status: Follow-up Date: 11/25/18 Follow-up type: In-patient    Mihir Flanigan R Tyechia Allmendinger 11/24/2018, 5:00 PM

## 2018-11-24 NOTE — Anesthesia Postprocedure Evaluation (Signed)
Anesthesia Post Note  Patient: Linda Holmes  Procedure(s) Performed: CESAREAN SECTION (N/A )     Patient location during evaluation: PACU Anesthesia Type: Epidural Level of consciousness: oriented and awake and alert Pain management: pain level controlled Vital Signs Assessment: post-procedure vital signs reviewed and stable Respiratory status: spontaneous breathing, respiratory function stable and nonlabored ventilation Cardiovascular status: blood pressure returned to baseline and stable Postop Assessment: no headache, no backache, no apparent nausea or vomiting, epidural receding and patient able to bend at knees Anesthetic complications: no    Murphy Duzan A.

## 2018-11-24 NOTE — Plan of Care (Signed)
  Problem: Clinical Measurements: Goal: Will remain free from infection Outcome: Progressing   Problem: Elimination: Goal: Will not experience complications related to urinary retention Outcome: Progressing   Problem: Skin Integrity: Goal: Demonstration of wound healing without infection will improve Outcome: Progressing

## 2018-11-24 NOTE — Lactation Note (Signed)
This note was copied from a baby's chart. Lactation Consultation Note  Patient Name: Linda Holmes JOACZ'Y Date: 11/24/2018 Reason for consult: Initial assessment  LC Initial Visit:  Attempted to visit with mother, however, she is in the NICU at the present time.  There is a DEBP set up at bedside.  Will return later today for a visit.   Maternal Data    Feeding    LATCH Score                   Interventions    Lactation Tools Discussed/Used     Consult Status Consult Status: Follow-up Date: 11/24/18 Follow-up type: In-patient    Kavari Parrillo R Otis Portal 11/24/2018, 3:27 PM

## 2018-11-24 NOTE — Progress Notes (Signed)
Patient has had little urinary output this shift. On assessment, urinary output is pink and cloudy. Patient does not complain of abdominal pain or urgency to urinate. Dr. Rip Harbour notified. Orders to give a 1,000L bolus of normal saline and follow with 10mg  of lasix. Will give and continue to monitor.

## 2018-11-24 NOTE — Transfer of Care (Signed)
Immediate Anesthesia Transfer of Care Note  Patient: Linda Holmes  Procedure(s) Performed: CESAREAN SECTION (N/A )  Patient Location: PACU  Anesthesia Type:Epidural  Level of Consciousness: awake, alert  and oriented  Airway & Oxygen Therapy: Patient Spontanous Breathing  Post-op Assessment: Report given to RN and Post -op Vital signs reviewed and stable  Post vital signs: Reviewed and stable  Last Vitals:  Vitals Value Taken Time  BP 118/61 11/24/18 0218  Temp    Pulse 122 11/24/18 0221  Resp 16 11/24/18 0221  SpO2 89 % 11/24/18 0221  Vitals shown include unvalidated device data.  Last Pain:  Vitals:   11/24/18 0017  TempSrc:   PainSc: Asleep      Patients Stated Pain Goal: 3 (02/33/43 5686)  Complications: No apparent anesthesia complications

## 2018-11-24 NOTE — Op Note (Signed)
Linda Holmes PROCEDURE DATE: 11/23/2018 - 11/24/2018  PREOPERATIVE DIAGNOSIS: Intrauterine pregnancy at  [redacted]w[redacted]d weeks gestation; non-reassuring fetal status  POSTOPERATIVE DIAGNOSIS: The same  PROCEDURE:     Cesarean Section  SURGEON:  Dr. Mora Bellman  ASSISTANT: Dr. Marice Potter  INDICATIONS: Linda Holmes is a 28 y.o. G1P0 at [redacted]w[redacted]d scheduled for cesarean section secondary to non-reassuring fetal status.  The risks of cesarean section discussed with the patient included but were not limited to: bleeding which may require transfusion or reoperation; infection which may require antibiotics; injury to bowel, bladder, ureters or other surrounding organs; injury to the fetus; need for additional procedures including hysterectomy in the event of a life-threatening hemorrhage; placental abnormalities wth subsequent pregnancies, incisional problems, thromboembolic phenomenon and other postoperative/anesthesia complications. The patient concurred with the proposed plan, giving informed written consent for the procedure.    FINDINGS:  Viable female infant in cephalic presentation.  Apgars not available at the time of note. Thick meconium stained amniotic fluid.  Intact placenta, three vessel cord.  Normal uterus, fallopian tubes and ovaries bilaterally.  ANESTHESIA:    Spinal INTRAVENOUS FLUIDS:2500 ml ESTIMATED BLOOD LOSS: 558 ml URINE OUTPUT:  100 ml SPECIMENS: Placenta sent to pathology COMPLICATIONS: None immediate  PROCEDURE IN DETAIL:  The patient received intravenous antibiotics and had sequential compression devices applied to her lower extremities while in the preoperative area.  She was then taken to the operating room where anesthesia was induced and was found to be adequate. A foley catheter was placed into her bladder and attached to Linda Holmes gravity. She was then placed in a dorsal supine position with a leftward tilt, and prepped and draped in a sterile manner. After an adequate timeout was  performed, a Pfannenstiel skin incision was made with scalpel and carried through to the underlying layer of fascia. The fascia was incised in the midline and this incision was extended bilaterally using the Mayo scissors. Kocher clamps were applied to the superior aspect of the fascial incision and the underlying rectus muscles were dissected off bluntly. A similar process was carried out on the inferior aspect of the facial incision. The rectus muscles were separated in the midline bluntly and the peritoneum was entered bluntly. The Alexis self-retaining retractor was introduced into the abdominal cavity. Attention was turned to the lower uterine segment where a transverse hysterotomy was made with a scalpel and extended bilaterally bluntly. The infant was successfully delivered, and cord was clamped and cut and infant was handed over to awaiting neonatology team. Uterine massage was then administered and the placenta delivered intact with three-vessel cord. The uterus was cleared of clot and debris.  The hysterotomy was closed with 0 Vicryl in a running locked fashion, and an imbricating layer was also placed with a 0 Vicryl. Overall, excellent hemostasis was noted. The pelvis copiously irrigated and cleared of all clot and debris. Hemostasis was confirmed on all surfaces.  The peritoneum and the muscles were reapproximated using 0 vicryl interrupted stitches. The fascia was then closed using 0 Vicryl in a running fashion.  The subcutaneous layer was reapproximated with plain gut and the skin was closed in a subcuticular fashion using 3.0 Vicryl. The patient tolerated the procedure well. Sponge, lap, instrument and needle counts were correct x 2. She was taken to the recovery room in stable condition.    Linda Holmes ConstantMD  11/24/2018 1:58 AM

## 2018-11-24 NOTE — Lactation Note (Signed)
This note was copied from a baby's chart. Lactation Consultation Note  Patient Name: Linda Holmes [id number] Date: 11/24/2018 Reason for consult: Follow-up assessment  P1 mother whose infant is now 41 hours old.  This is a term infant weighing < 6 lbs and in the NICU.  RN requested latch assistance.  Positioned mother appropriately in the chair with pillow support.  Mother was interested in doing the cross cradle position.  Had mother demonstrate hand expression and she was able to express colostrum drops which I finger fed back to baby.  His suck was somewhat disorganized.  Continued to work with his suck for approximately 5 minutes prior to latching and he became better with his grasp.  Assisted to latch to the left breast easily.  Baby had a wide gape and flanged lips which mother observed.  Demonstrated breast compressions.  Reminded mother a couple of times about proper hand and finger positioning.  She wanted to continue to press too closely to his nose.  Showed mother how he was able to breathe and feed comfortably while latched deeply into breast tissue.    Observed baby feeding for 13 minutes before releasing.  Mother tried to burp him.  He continued to show feeding cues and I assisted him back to the same breast in the same position.  He continued for another 8 minutes before becoming too sleepy to continue.  Showed mother how to gently stimulate him to keep him awake and sucking at the breast.  Placed him STS after his feeding and he fell asleep.  Upon release mother's nipple was rounded and intact.  Mother denied pain with latching.  RN in room and updated.  RN stated that baby is ad lib feedings so mother will continue to attempt breast feeding when he visits the NICU.  RN has been able to obtain a couplet care room and mother will be moved this evening.  She was happy to hear this news.      Mother was excited to Maternal Data Formula Feeding for Exclusion: No Has patient been  taught Hand Expression?: Yes Does the patient have breastfeeding experience prior to this delivery?: No  Feeding Feeding Type: Breast Fed  LATCH Score Latch: Repeated attempts needed to sustain latch, nipple held in mouth throughout feeding, stimulation needed to elicit sucking reflex.  Audible Swallowing: A few with stimulation  Type of Nipple: Everted at rest and after stimulation  Comfort (Breast/Nipple): Soft / non-tender  Hold (Positioning): Assistance needed to correctly position infant at breast and maintain latch.  LATCH Score: 7  Interventions Interventions: Assisted with latch;Skin to skin;Hand express;Support pillows;Adjust position;Breast compression  Lactation Tools Discussed/Used Pump Review: (Mother needed no review of pump set up)   Consult Status Consult Status: Follow-up Date: 11/25/18 Follow-up type: In-patient    Little Ishikawa 11/24/2018, 6:52 PM

## 2018-11-25 NOTE — Lactation Note (Signed)
This note was copied from a baby's chart. Lactation Consultation Note  Patient Name: Linda Holmes Date: 11/25/2018 Reason for consult: Follow-up assessment  LC Follow Up:  Attempted to visit with mother, however, she is not in her room at this time.  I assisted with latching and feeding in the NICU yesterday and it appears from the chart notes that baby has been breast feeding well.  Mother is aware that she can call for my assistance as needed.      Consult Status Consult Status: Follow-up Date: 11/26/18 Follow-up type: In-patient    Aldair Rickel R Ermon Sagan 11/25/2018, 1:35 PM

## 2018-11-25 NOTE — Progress Notes (Signed)
Subjective: Postpartum Day 1: Cesarean Delivery Ms Vary has no complaints this morning. Ambulating and voiding without problems, Tolerating diet . Pain controlled. Breast pumping  Objective: Vital signs in last 24 hours: Temp:  [97.7 F (36.5 C)-98.3 F (36.8 C)] 97.7 F (36.5 C) (09/22 0850) Pulse Rate:  [87-100] 100 (09/22 0850) Resp:  [16-18] 17 (09/22 0850) BP: (107-136)/(63-78) 129/74 (09/22 0850) SpO2:  [96 %-100 %] 100 % (09/22 0850)  Physical Exam:  General: alert Lochia: appropriate Uterine Fundus: firm Incision: healing well DVT Evaluation: No evidence of DVT seen on physical exam.  Recent Labs    11/23/18 0849 11/24/18 0553  HGB 13.0 10.2*  HCT 39.0 30.7*    Assessment/Plan: Status post Cesarean section. Doing well postoperatively.  Continue current care.  Chancy Milroy 11/25/2018, 11:35 AM

## 2018-11-26 LAB — SURGICAL PATHOLOGY

## 2018-11-26 MED ORDER — IBUPROFEN 800 MG PO TABS: 800.0000 mg | ORAL_TABLET | Freq: Three times a day (TID) | ORAL | 0 refills | Status: DC

## 2018-11-26 MED ORDER — OXYCODONE HCL 5 MG PO TABS: 5.0000 mg | ORAL_TABLET | ORAL | 0 refills | Status: DC | PRN

## 2018-11-26 NOTE — Progress Notes (Signed)
Pt discharged to home with significant other and anticipating baby's discharge from NICU today as well.  Pt ambulated to NICU with plans to leave hospital from there.  No equipment for home ordered at discharge. Pt has been breastfeeding baby in NICU overnight and she has a DEBP at home.  Pt home with her breast pump kit parts that were used during this hospitalization.

## 2018-11-26 NOTE — Discharge Instructions (Signed)

## 2018-12-09 ENCOUNTER — Other Ambulatory Visit: Payer: Self-pay

## 2018-12-09 ENCOUNTER — Ambulatory Visit (INDEPENDENT_AMBULATORY_CARE_PROVIDER_SITE_OTHER): Payer: Medicaid Other

## 2018-12-09 VITALS — BP 123/76 | HR 88 | Wt 178.1 lb

## 2018-12-09 DIAGNOSIS — Z5189 Encounter for other specified aftercare: Secondary | ICD-10-CM

## 2018-12-09 NOTE — Progress Notes (Signed)
Pt here today following c-section on 11/24/18 for incision check. Incision was open to air upon arrival. Incision clean and dry with well approximated edges; small area of pink, healing tissue to right side of incision . No edema, warmth, or oozing observed. Educated pt to continue good wound care and reviewed signs and symptoms of infection. Pt will follow up at [facility] visit on 12/23/18.  Apolonio Schneiders RN 12/23/18

## 2018-12-16 NOTE — Progress Notes (Signed)
Chart reviewed for nurse visit. Agree with plan of care.   Starr Lake, CNM 12/16/2018 2:12 PM

## 2018-12-23 ENCOUNTER — Other Ambulatory Visit: Payer: Self-pay

## 2018-12-23 ENCOUNTER — Encounter: Payer: Self-pay | Admitting: Nurse Practitioner

## 2018-12-23 ENCOUNTER — Ambulatory Visit (INDEPENDENT_AMBULATORY_CARE_PROVIDER_SITE_OTHER): Payer: Medicaid Other | Admitting: Nurse Practitioner

## 2018-12-23 DIAGNOSIS — Z1389 Encounter for screening for other disorder: Secondary | ICD-10-CM | POA: Diagnosis not present

## 2018-12-23 DIAGNOSIS — Z98891 History of uterine scar from previous surgery: Secondary | ICD-10-CM | POA: Insufficient documentation

## 2018-12-23 NOTE — Progress Notes (Signed)
C-Section scar is red and sometimes painful Subjective:     Linda Holmes is a 28 y.o. female who presents for a postpartum visit. She is 4 weeks postpartum following a low cervical transverse Cesarean section. I have fully reviewed the prenatal and intrapartum course. The delivery was at 40.3 gestational weeks. Outcome: primary cesarean section, low transverse incision. Anesthesia: spinal. Postpartum course has been unremarkable. Baby's course has been unremarkable. Baby is feeding by both breast and bottle - Enfamil with Iron. Bleeding no bleeding. Bowel function is abnormal: constipation. Bladder function is normal. Patient is not sexually active. Contraception method is none. Postpartum depression screening: negative.  Takes stool softener almost daily.  Having some redness at C/S incision but denies having any pain.  The following portions of the patient's history were reviewed and updated as appropriate: allergies, current medications, past family history, past medical history, past social history, past surgical history and problem list.  Review of Systems Pertinent items noted in HPI and remainder of comprehensive ROS otherwise negative.   Objective:    BP 111/73   Pulse 99   Wt 176 lb (79.8 kg)   BMI 31.18 kg/m   General:  alert, cooperative and no distress   Breasts:  deferred breastfeeding  Lungs: clear to auscultation bilaterally  Heart:  regular rate and rhythm, S1, S2 normal, no murmur, click, rub or gallop  Abdomen: soft, non-tender; bowel sounds normal; no masses,  no organomegaly   Vulva:  not evaluated  Vagina: not evaluated  Cervix:  not evaluated  Corpus: not examined  Adnexa:  not evaluated  Rectal Exam: Not performed.       C/S incision has one area approx 2mm where incision is superficially open.  Dr. Rip Harbour in and silver nitrate applied to the area.  Advised to keep the area very dry and during the day, may use a gauze across the area.  Assessment:    Normal  postpartum exam. Pap smear 06/2017.   Incision had silver nitrate application in one specific area by Dr. Rip Harbour  Plan:    1. Contraception: natural family planning  Advised getting ovulation kits and using in addition as ovulation will occur prior to menses and with breastfeeding it is difficult to tell when ovulation returns and it may be irregular with breastfeeding.  Advised when baby is sleeping 8-9 hours or using more formula or baby is using supplemental food, ovulation may resume and ovulation kits may be helpful in not getting pregnant for 18 months. 2. Keep working on healthy foods and exercise so weight returns to BMI less than 25.  Drink at least 8 8-oz glasses of water every day. Take stool softener as needed. 3. Follow up in: 1 year or as needed.    Earlie Server, RN, MSN, NP-BC Nurse Practitioner, [facility] for [employer], [place] [facility] 12/23/2018 4:28 PM

## 2019-03-20 ENCOUNTER — Encounter (HOSPITAL_COMMUNITY): Payer: Self-pay | Admitting: Obstetrics and Gynecology

## 2019-03-26 DIAGNOSIS — Z20822 Contact with and (suspected) exposure to covid-19: Secondary | ICD-10-CM | POA: Diagnosis not present

## 2019-03-26 DIAGNOSIS — Z9189 Other specified personal risk factors, not elsewhere classified: Secondary | ICD-10-CM | POA: Diagnosis not present

## 2019-10-13 ENCOUNTER — Other Ambulatory Visit: Payer: Self-pay

## 2019-10-13 ENCOUNTER — Ambulatory Visit (INDEPENDENT_AMBULATORY_CARE_PROVIDER_SITE_OTHER): Payer: Medicaid Other | Admitting: *Deleted

## 2019-10-13 VITALS — BP 90/65 | HR 92 | Temp 98.5°F | Ht 63.0 in | Wt 193.4 lb

## 2019-10-13 DIAGNOSIS — Z3200 Encounter for pregnancy test, result unknown: Secondary | ICD-10-CM

## 2019-10-13 DIAGNOSIS — Z3201 Encounter for pregnancy test, result positive: Secondary | ICD-10-CM | POA: Diagnosis not present

## 2019-10-13 LAB — POCT URINE PREGNANCY: Preg Test, Ur: POSITIVE — AB

## 2019-10-13 NOTE — Progress Notes (Signed)
   Linda Holmes presents today for UPT. She has no unusual complaints. LMP: 08/17/2019    OBJECTIVE: Appears well, in no apparent distress.  OB History    Gravida  1   Para  1   Term  1   Preterm      AB      Living  1     SAB      TAB      Ectopic      Multiple  0   Live Births  1          Home UPT Result: Positive In-Office UPT result: Positive I have reviewed the patient's medical, obstetrical, social, and family histories, and medications.   EDD: 05/23/20 by LMP  ASSESSMENT: Positive pregnancy test  PLAN Prenatal care to be completed at: [facility]  Clovis Pu, RN

## 2019-11-04 ENCOUNTER — Other Ambulatory Visit: Payer: Self-pay

## 2019-11-04 ENCOUNTER — Telehealth (INDEPENDENT_AMBULATORY_CARE_PROVIDER_SITE_OTHER): Payer: Medicaid Other | Admitting: *Deleted

## 2019-11-04 DIAGNOSIS — Z98891 History of uterine scar from previous surgery: Secondary | ICD-10-CM

## 2019-11-04 DIAGNOSIS — O0990 Supervision of high risk pregnancy, unspecified, unspecified trimester: Secondary | ICD-10-CM | POA: Insufficient documentation

## 2019-11-04 DIAGNOSIS — O09899 Supervision of other high risk pregnancies, unspecified trimester: Secondary | ICD-10-CM | POA: Insufficient documentation

## 2019-11-04 DIAGNOSIS — O99210 Obesity complicating pregnancy, unspecified trimester: Secondary | ICD-10-CM | POA: Insufficient documentation

## 2019-11-04 DIAGNOSIS — Z3490 Encounter for supervision of normal pregnancy, unspecified, unspecified trimester: Secondary | ICD-10-CM

## 2019-11-04 NOTE — Progress Notes (Signed)
Addendum: At the time of the visit provider was at [facility] office and patient was at home and visit was virtual. Legrand Como

## 2019-11-04 NOTE — Progress Notes (Signed)
Patient was assessed and managed by nursing staff during this encounter. I have reviewed the chart and agree with the documentation and plan.  Vonzella Nipple, PA-C 11/04/2019 11:52 AM

## 2019-11-04 NOTE — Patient Instructions (Signed)
  At [facility], we work as an integrated team, providing care to address both physical and emotional health. Your medical provider may refer you to see our Behavioral Health Clinician (BHC) on the same day you see your medical provider, as availability permits.  Our BHC is available to all patients, visits generally last between 20-30 minutes, but can be longer or shorter, depending on patient need. The BHC offers help with stress management, coping with symptoms of depression and anxiety, major life changes , sleep issues, changing risky behavior, grief and loss, life stress, working on personal life goals, and  behavioral health issues, as these all affect your overall health and wellness.  The BHC is NOT available for the following: FMLA paperwork, court-ordered evaluations, specialty assessments (custody or disability), letters to employers, or obtaining certification for an emotional support animal. The BHC does not provide long-term therapy. You have the right to refuse integrated behavioral health services, or to reschedule to see the BHC at a later date.  Exception: If you are having thoughts of suicide, we require that you either see the BHC for further assessment, or contract for safety with your medical provider. Confidentiality exception: If it is suspected that a child or disabled adult is being abused or neglected, we are required by law to report that to either Child Protective Services or Adult Protective Services.  If you have a diagnosis of Bipolar affective disorder, Schizophrenia, or recurrent Major depressive disorder, we will recommend that you establish care with a psychiatrist, as these are lifelong, chronic conditions, and we want your overall emotional health and medications to be more closely monitored. If you anticipate needing extended maternity leave due to mental illness, it it recommended you inform your medical provider, so  we can put in a referral to a  psychiatrist as soon as possible. The BHC is unable to recommend an extended maternity leave for mental health issues. Your medical provider or BHC may refer you to a therapist for ongoing, traditional therapy, or to a psychiatrist, for medication management, if it would benefit your overall health. Depending on your insurance, you may have a copay to see the BHC. If you are uninsured, it is recommended that you apply for financial assistance. (Forms may be requested at the front desk for in-person visits, via MyChart, or request a form during a virtual visit).  If you see the BHC more than 6 times, you will have to complete a comprehensive clinical assessment interview with the BHC to resume integrated services.  For virtual visits with the BHC, you must be physically in the state of [place] at the time of the visit. For example, if you live in [place], you will have to do an in-person visit with the BHC. If you are going out of the state or country for any reason, the BHC may see you virtually when you return to [place], but not while you are physically outside of [place].    

## 2019-11-04 NOTE — Progress Notes (Signed)
[id number] Linda Holmes not connected virtually.  I called and verified her identity with 2 identifiers. I then informed her I am calling for her virtual visit and asked if she can connect virtually with me; she agreed she will in a moment. Legrand Como  I connected with  Linda Holmes on 11/04/19 at  8:15 AM EDT by virtually and verified that I am speaking with the correct person using two identifiers.   I discussed the limitations, risks, security and privacy concerns of performing an evaluation and management service by virtually and the availability of in person appointments. I also discussed with the patient that there may be a patient responsible charge related to this service. The patient expressed understanding and agreed to proceed.  I explained I am completing her New OB Intake today. We discussed Her EDD and that it is based on  sure LMP . I reviewed her allergies, meds, OB History, Medical /Surgical history, and appropriate screenings. I informed her of [facility] services.    She is G2  P1 with history of c/s x1 for non reactive nst.  I explained we will have her take her blood pressure weekly during her pregnancy.  She verified she does have a blood pressure cuff but she is not sure if it working properly. I asked her to bring the blood pressure cuff with her to her first ob appointment so we can check it and if it is not working correctly we can place a new presription for a blood pressure cuff. Explained after her first ob appointment   we will have her take her blood pressure weekly I  explained we will have her log her blood pressures into an app that I will send her called Babyscripts. The  app was sent to her while on phone. She will download it later today.    I explained she will have some visits in office and some virtually. She already has Sports coach. I reviewed her new ob  appointment date/ time with her , our location and to wear mask,  I explained she will have a pelvic exam, ob bloodwork, hemoglobin  a1C, cbg ,pap, and  genetic testing if desired,- she does want a panorama. She thinks she had a pap at [facility] although I could not locate it in Care Everywhere.  She will sign a release for results at her new ob visit.  I explained I will scheduled an Korea at 19 weeks and she will see the appointment later in MyChart. She voices understanding.   Gaylen Venning,RN 11/04/2019  8:27 AM

## 2019-11-06 ENCOUNTER — Ambulatory Visit (INDEPENDENT_AMBULATORY_CARE_PROVIDER_SITE_OTHER): Payer: Medicaid Other | Admitting: Medical

## 2019-11-06 ENCOUNTER — Other Ambulatory Visit: Payer: Self-pay

## 2019-11-06 ENCOUNTER — Encounter: Payer: Self-pay | Admitting: Medical

## 2019-11-06 ENCOUNTER — Encounter: Payer: Self-pay | Admitting: Family Medicine

## 2019-11-06 ENCOUNTER — Other Ambulatory Visit (HOSPITAL_COMMUNITY)
Admission: RE | Admit: 2019-11-06 | Discharge: 2019-11-06 | Disposition: A | Discharge status: Home / Self Care | Payer: Medicaid Other | Source: Ambulatory Visit | Attending: Medical | Admitting: Medical

## 2019-11-06 VITALS — BP 122/72 | HR 90 | Wt 191.3 lb

## 2019-11-06 DIAGNOSIS — O09891 Supervision of other high risk pregnancies, first trimester: Secondary | ICD-10-CM | POA: Diagnosis not present

## 2019-11-06 DIAGNOSIS — Z3491 Encounter for supervision of normal pregnancy, unspecified, first trimester: Secondary | ICD-10-CM | POA: Diagnosis not present

## 2019-11-06 DIAGNOSIS — D563 Thalassemia minor: Secondary | ICD-10-CM

## 2019-11-06 DIAGNOSIS — O09899 Supervision of other high risk pregnancies, unspecified trimester: Secondary | ICD-10-CM

## 2019-11-06 DIAGNOSIS — O0990 Supervision of high risk pregnancy, unspecified, unspecified trimester: Secondary | ICD-10-CM

## 2019-11-06 DIAGNOSIS — Z3A11 11 weeks gestation of pregnancy: Secondary | ICD-10-CM

## 2019-11-06 DIAGNOSIS — Z98891 History of uterine scar from previous surgery: Secondary | ICD-10-CM

## 2019-11-06 DIAGNOSIS — O99210 Obesity complicating pregnancy, unspecified trimester: Secondary | ICD-10-CM

## 2019-11-06 MED ORDER — ASPIRIN EC 81 MG PO TBEC: 81.0000 mg | DELAYED_RELEASE_TABLET | Freq: Every day | ORAL | 11 refills | Status: DC

## 2019-11-06 NOTE — Progress Notes (Signed)
Pt states her last Pap was at [facility] in [place] on 02/06/2020Negative , has info on Phone from their website.

## 2019-11-06 NOTE — Progress Notes (Signed)
   PRENATAL VISIT NOTE  Subjective:  Linda Holmes is a 29 y.o. G2P1001 at 63w4dbeing seen today for her first prenatal visit for this pregnancy.  She is currently monitored for the following issues for this high-risk pregnancy and has Thalassemia trait; History of cesarean delivery; Supervision of high risk pregnancy, antepartum; Short interval between pregnancies affecting pregnancy, antepartum; and Obesity in pregnancy on their problem list.  Patient reports no complaints.  Contractions: Not present. Vag. Bleeding: None.  Movement: Absent. Denies leaking of fluid.   She is planning to breastfeed. Declines contraception.   The following portions of the patient's history were reviewed and updated as appropriate: allergies, current medications, past family history, past medical history, past social history, past surgical history and problem list.   Objective:   Vitals:   11/06/19 0910  BP: 122/72  Pulse: 90  Weight: 191 lb 4.8 oz (86.8 kg)    Fetal Status: Fetal Heart Rate (bpm): 153   Movement: Absent     General:  Alert, oriented and cooperative. Patient is in no acute distress.  Skin: Skin is warm and dry. No rash noted.   Cardiovascular: Normal heart rate and rhythm noted  Respiratory: Normal respiratory effort, no problems with respiration noted. Clear to auscultation.   Abdomen: Soft, gravid, appropriate for gestational age. Normal bowel sounds. Non-tender. Pain/Pressure: Absent     Pelvic: Cervical exam performed Dilation: Closed      Breast: symmetric, no masses, no nipple discharge or bleeding   Extremities: Normal range of motion.  Edema: None  Mental Status: Normal mood and affect. Normal behavior. Normal judgment and thought content.   Assessment and Plan:  Pregnancy: G2P1001 at 135w4d.Supervision of high risk pregnancy, antepartum - [place] Comp Less 14 Wks; Future - CBC/D/Plt+RPR+Rh+ABO+Rub Ab... - Culture, OB Urine - Genetic Screening - Hemoglobin A1c -  Cervicovaginal ancillary only( [place])  The patient was counseled on the potential benefits and lack of known risks of COVID vaccination, during pregnancy and breastfeeding, on today's visit. The patient's questions and concerns were addressed today, including safety in preganncy. The patient is still unsure of her decision for vaccination.   2. Short interval between pregnancies affecting pregnancy, antepartum Last child born 11/2018  3. Obesity in pregnancy - aspirin EC 81 MG tablet; Take 1 tablet (81 mg total) by mouth daily. Swallow whole.  Dispense: 30 tablet; Refill: 11 - Comp Met (CMET) - Discussed recommended weight gain of 11-20 lbs  4. History of cesarean delivery - Desires TOLAC  5. Thalassemia trait - Unsure of type, Horizon today   6. [redacted] weeks gestation of pregnancy  Preterm labor/first trimester warning symptoms and general obstetric precautions including but not limited to vaginal bleeding, contractions, leaking of fluid and fetal movement were reviewed in detail with the patient. Please refer to After Visit Summary for other counseling recommendations.   Discussed the normal visit cadence for prenatal care Discussed the nature of our practice with multiple providers including residents and students   Return in about 4 weeks (around 12/04/2019) for LOB, Virtual.  Future Appointments  Date Time Provider [place]9/14/2021  1:00 PM [facility] 10:15 AM [facility]/27/2021  8:45 AM [facility]-MFC NURSE [facility]10/27/2021  9:00 AM WMC-MFC US1 WMC-MFCUS WMChrist[facility]  JuKerry HoughPA-C

## 2019-11-06 NOTE — Patient Instructions (Signed)
Safe Medications in Pregnancy   Acne:  Benzoyl Peroxide  Salicylic Acid   Backache/Headache:  Tylenol: 2 regular strength every 4 hours OR        2 Extra strength every 6 hours   Colds/Coughs/Allergies:  Benadryl (alcohol free) 25 mg every 6 hours as needed  Breath right strips  Claritin  Cepacol throat lozenges  Chloraseptic throat spray  Cold-Eeze- up to three times per day  Cough drops, alcohol free  Flonase (by prescription only)  Guaifenesin  Mucinex  Robitussin DM (plain only, alcohol free)  Saline nasal spray/drops  Sudafed (pseudoephedrine) & Actifed * use only after [redacted] weeks gestation and if you do not have high blood pressure  Tylenol  Vicks Vaporub  Zinc lozenges  Zyrtec   Constipation:  Colace  Ducolax suppositories  Fleet enema  Glycerin suppositories  Metamucil  Milk of magnesia  Miralax  Senokot  Smooth move tea   Diarrhea:  Kaopectate  Imodium A-D   *NO pepto Bismol   Hemorrhoids:  Anusol  Anusol HC  Preparation H  Tucks   Indigestion:  Tums  Maalox  Mylanta  Zantac  Pepcid   Insomnia:  Benadryl (alcohol free) 25mg every 6 hours as needed  Tylenol PM  Unisom, no Gelcaps   Leg Cramps:  Tums  MagGel   Nausea/Vomiting:  Bonine  Dramamine  Emetrol  Ginger extract  Sea bands  Meclizine  Nausea medication to take during pregnancy:  Unisom (doxylamine succinate 25 mg tablets) Take one tablet daily at bedtime. If symptoms are not adequately controlled, the dose can be increased to a maximum recommended dose of two tablets daily (1/2 tablet in the morning, 1/2 tablet mid-afternoon and one at bedtime).  Vitamin B6 100mg tablets. Take one tablet twice a day (up to 200 mg per day).   Skin Rashes:  Aveeno products  Benadryl cream or 25mg every 6 hours as needed  Calamine Lotion  1% cortisone cream   Yeast infection:  Gyne-lotrimin 7  Monistat 7    **If taking multiple medications, please check labels to avoid  duplicating the same active ingredients  **take medication as directed on the label  ** Do not exceed 4000 mg of tylenol in 24 hours  **Do not take medications that contain aspirin or ibuprofen         Childbirth Education Options: [facility] Classes:  Childbirth education classes can help you get ready for a positive parenting experience. You can also meet other expectant parents and get free stuff for your baby. Each class runs for five weeks on the same night and costs $45 for the mother-to-be and her support person. Medicaid covers the cost if you are eligible. Call [phone] to register. [facility] Childbirth Education:  [phone] or [phone] or [email or web address]  Baby & Me Class: Discuss newborn & infant parenting and family adjustment issues with other new mothers in a relaxed environment. Each week brings a new speaker or baby-centered activity. We encourage new mothers to join us every Thursday at 11:00am. Babies birth until crawling. No registration or fee. Daddy Boot Camp: This course offers Dads-to-be the tools and knowledge needed to feel confident on their journey to becoming new fathers. Experienced dads, who have been trained as coaches, teach dads-to-be how to hold, comfort, diaper, swaddle and play with their infant while being able to support the new mom as well. A class for men taught by men. $25/dad Big Brother/Big Sister: Let your children share in the joy   mothers as they journey through the adjustments and struggles of that sometimes overwhelming first year after the birth of a child. Tuesdays at 10:00am and  Thursdays at 6:00pm. Babies welcome. No registration or fee. Breastfeeding Support Group: This group is a mother-to-mother support circle where moms have the opportunity to share their breastfeeding experiences. A Lactation Consultant is present for questions and concerns. Meets each Tuesday at 11:00am. No fee or registration. Breastfeeding Your Baby: Learn what to expect in the first days of breastfeeding your newborn.  This class will help you feel more confident with the skills needed to begin your breastfeeding experience. Many new mothers are concerned about breastfeeding after leaving the hospital. This class will also address the most common fears and challenges about breastfeeding during the first few weeks, months and beyond. (call for fee) Comfort Techniques and Tour: This 2 hour interactive class will provide you the opportunity to learn & practice hands-on techniques that can help relieve some of the discomfort of labor and encourage your baby to rotate toward the best position for birth. You and your partner will be able to try a variety of labor positions with birth balls and rebozos as well as practice breathing, relaxation, and visualization techniques. A tour of the [facility] is included with this class. $20 per registrant and support person Childbirth Class- Weekend Option: This class is a Weekend version of our Birth & Baby series. It is designed for parents who have a difficult time fitting several weeks of classes into their schedule. It covers the care of your newborn and the basics of labor and childbirth. It also includes a Maternity Care Center Tour of [facility] and lunch. The class is held two consecutive days: beginning on Friday evening from 6:30 - 8:30 p.m. and the next day, Saturday from 9 a.m. - 4 p.m. (call for fee) Waterbirth Class: Interested in a waterbirth?  This informational class will help you discover whether waterbirth is the right fit  for you. Education about waterbirth itself, supplies you would need and how to assemble your support team is what you can expect from this class. Some obstetrical practices require this class in order to pursue a waterbirth. (Not all obstetrical practices offer waterbirth-check with your healthcare provider.) Register only the expectant mom, but you are encouraged to bring your partner to class! Required if planning waterbirth, no fee. Infant/Child CPR: Parents, grandparents, babysitters, and friends learn Cardio-Pulmonary Resuscitation skills for infants and children. You will also learn how to treat both conscious and unconscious choking in infants and children. This Family & Friends program does not offer certification. Register each participant individually to ensure that enough mannequins are available. (Call for fee) Grandparent Love: Expecting a grandbaby? This class is for you! Learn about the latest infant care and safety recommendations and ways to support your own child as he or she transitions into the parenting role. Taught by Registered Nurses who are childbirth instructors, but most importantly...they are grandmothers too! $10/person. Childbirth Class- Natural Childbirth: This series of 5 weekly classes is for expectant parents who want to learn and practice natural methods of coping with the process of labor and childbirth. Relaxation, breathing, massage, visualization, role of the partner, and helpful positioning are highlighted. Participants learn how to be confident in their body's ability to give birth. This class will empower and help parents make informed decisions about their own care. Includes discussion that will help new parents transition into the immediate postpartum period. Maternity   who want to learn and practice natural methods of coping with the process of labor and childbirth. Relaxation, breathing, massage, visualization, role of the partner, and helpful positioning are highlighted. Participants learn how to be confident in their body's ability to give birth. This class will empower and help parents make informed decisions about their own care. Includes discussion that will help new parents transition into the immediate postpartum period. Maternity Care Center Tour of [facility] is included. We suggest taking this class between 25-32 weeks, but  it's only a recommendation. $75 per registrant and one support person or $30 Medicaid. Childbirth Class- 3 week Series: This option of 3 weekly classes helps you and your labor partner prepare for childbirth. Newborn care, labor & birth, cesarean birth, pain management, and comfort techniques are discussed and a Maternity Care Center Tour of [facility] is included. The class meets at the same time, on the same day of the week for 3 consecutive weeks beginning with the starting date you choose. $60 for registrant and one support person.  Marvelous Multiples: Expecting twins, triplets, or more? This class covers the differences in labor, birth, parenting, and breastfeeding issues that face multiples' parents. NICU tour is included. Led by a Certified Childbirth Educator who is the mother of twins. No fee. Caring for Baby: This class is for expectant and adoptive parents who want to learn and practice the most up-to-date newborn care for their babies. Focus is on birth through the first six weeks of life. Topics include feeding, bathing, diapering, crying, umbilical cord care, circumcision care and safe sleep. Parents learn to recognize symptoms of illness and when to call the pediatrician. Register only the mom-to-be and your partner or support person can plan to come with you! $10 per registrant and support person Childbirth Class- online option: This online class offers you the freedom to complete a Birth and Baby series in the comfort of your own home. The flexibility of this option allows you to review sections at your own pace, at times convenient to you and your support people. It includes additional video information, animations, quizzes, and extended activities. Get organized with helpful eClass tools, checklists, and trackers. Once you register online for the class, you will receive an email within a few days to accept the invitation and begin the class when the time is right for you. The content  will be available to you for 60 days. $60 for 60 days of online access for you and your support people.         

## 2019-11-07 LAB — COMPREHENSIVE METABOLIC PANEL
ALT: 14 IU/L (ref 0–32)
AST: 11 IU/L (ref 0–40)
Albumin/Globulin Ratio: 1.2 (ref 1.2–2.2)
Albumin: 3.7 g/dL — ABNORMAL LOW (ref 3.9–5.0)
Alkaline Phosphatase: 94 IU/L (ref 48–121)
BUN/Creatinine Ratio: 19 (ref 9–23)
BUN: 11 mg/dL (ref 6–20)
Bilirubin Total: 0.3 mg/dL (ref 0.0–1.2)
CO2: 22 mmol/L (ref 20–29)
Calcium: 9.3 mg/dL (ref 8.7–10.2)
Chloride: 102 mmol/L (ref 96–106)
Creatinine, Ser: 0.59 mg/dL (ref 0.57–1.00)
GFR calc Af Amer: 144 mL/min/{1.73_m2} (ref >59)
GFR calc non Af Amer: 125 mL/min/{1.73_m2} (ref >59)
Globulin, Total: 3 g/dL (ref 1.5–4.5)
Glucose: 75 mg/dL (ref 65–99)
Potassium: 4.2 mmol/L (ref 3.5–5.2)
Sodium: 137 mmol/L (ref 134–144)
Total Protein: 6.7 g/dL (ref 6.0–8.5)

## 2019-11-07 LAB — CBC/D/PLT+RPR+RH+ABO+RUB AB...
Antibody Screen: NEGATIVE
Basophils Absolute: 0 10*3/uL (ref 0.0–0.2)
Basos: 0 %
EOS (ABSOLUTE): 0.2 10*3/uL (ref 0.0–0.4)
Eos: 2 %
HCV Ab: <0.1 s/co ratio (ref 0.0–0.9)
HIV Screen 4th Generation wRfx: NONREACTIVE
Hematocrit: 38.3 % (ref 34.0–46.6)
Hemoglobin: 12.1 g/dL (ref 11.1–15.9)
Hepatitis B Surface Ag: NEGATIVE
Immature Grans (Abs): 0 10*3/uL (ref 0.0–0.1)
Immature Granulocytes: 0 %
Lymphocytes Absolute: 2.1 10*3/uL (ref 0.7–3.1)
Lymphs: 21 %
MCH: 23.4 pg — ABNORMAL LOW (ref 26.6–33.0)
MCHC: 31.6 g/dL (ref 31.5–35.7)
MCV: 74 fL — ABNORMAL LOW (ref 79–97)
Monocytes Absolute: 0.6 10*3/uL (ref 0.1–0.9)
Monocytes: 6 %
Neutrophils Absolute: 7.1 10*3/uL — ABNORMAL HIGH (ref 1.4–7.0)
Neutrophils: 71 %
Platelets: 346 10*3/uL (ref 150–450)
RBC: 5.17 x10E6/uL (ref 3.77–5.28)
RDW: 15.1 % (ref 11.7–15.4)
RPR Ser Ql: NONREACTIVE
Rh Factor: POSITIVE
Rubella Antibodies, IGG: 1.67 index (ref >0.99)
WBC: 10.1 10*3/uL (ref 3.4–10.8)

## 2019-11-07 LAB — HEMOGLOBIN A1C
Est. average glucose Bld gHb Est-mCnc: 111 mg/dL
Hgb A1c MFr Bld: 5.5 % (ref 4.8–5.6)

## 2019-11-07 LAB — HCV INTERPRETATION

## 2019-11-08 LAB — CULTURE, OB URINE

## 2019-11-08 LAB — URINE CULTURE, OB REFLEX

## 2019-11-10 ENCOUNTER — Encounter: Payer: Self-pay | Admitting: Medical

## 2019-11-10 LAB — CERVICOVAGINAL ANCILLARY ONLY
Chlamydia: NEGATIVE
Comment: NEGATIVE
Comment: NORMAL
Neisseria Gonorrhea: NEGATIVE

## 2019-11-17 ENCOUNTER — Ambulatory Visit
Admission: RE | Admit: 2019-11-17 | Discharge: 2019-11-17 | Disposition: A | Discharge status: Home / Self Care | Payer: Medicaid Other | Source: Ambulatory Visit | Attending: Medical | Admitting: Medical

## 2019-11-17 ENCOUNTER — Encounter: Payer: Self-pay | Admitting: General Practice

## 2019-11-17 ENCOUNTER — Other Ambulatory Visit: Payer: Self-pay

## 2019-11-17 DIAGNOSIS — O26841 Uterine size-date discrepancy, first trimester: Secondary | ICD-10-CM | POA: Diagnosis not present

## 2019-11-17 DIAGNOSIS — Z3A13 13 weeks gestation of pregnancy: Secondary | ICD-10-CM | POA: Diagnosis not present

## 2019-11-17 DIAGNOSIS — O0990 Supervision of high risk pregnancy, unspecified, unspecified trimester: Secondary | ICD-10-CM | POA: Diagnosis not present

## 2019-11-30 ENCOUNTER — Encounter: Payer: Self-pay | Admitting: *Deleted

## 2019-12-08 ENCOUNTER — Telehealth (INDEPENDENT_AMBULATORY_CARE_PROVIDER_SITE_OTHER): Payer: Medicaid Other | Admitting: Lactation Services

## 2019-12-08 ENCOUNTER — Encounter: Payer: Self-pay | Admitting: *Deleted

## 2019-12-08 DIAGNOSIS — O0990 Supervision of high risk pregnancy, unspecified, unspecified trimester: Secondary | ICD-10-CM

## 2019-12-08 NOTE — Telephone Encounter (Signed)
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 Patient reports she is aware and had genetic counseling with previous pregnancy. She reports FOB, who is the same FOB as the first child,  was not tested and they may be interested in having him tested. Gave her the phone number to [facility] to call and discuss having partner tested.   Patient with no questions or concerns at this time.

## 2019-12-11 ENCOUNTER — Telehealth (INDEPENDENT_AMBULATORY_CARE_PROVIDER_SITE_OTHER): Payer: Medicaid Other | Admitting: Medical

## 2019-12-11 ENCOUNTER — Other Ambulatory Visit: Payer: Self-pay

## 2019-12-11 ENCOUNTER — Encounter: Payer: Self-pay | Admitting: Medical

## 2019-12-11 DIAGNOSIS — D563 Thalassemia minor: Secondary | ICD-10-CM

## 2019-12-11 DIAGNOSIS — E669 Obesity, unspecified: Secondary | ICD-10-CM | POA: Diagnosis not present

## 2019-12-11 DIAGNOSIS — O99212 Obesity complicating pregnancy, second trimester: Secondary | ICD-10-CM | POA: Diagnosis not present

## 2019-12-11 DIAGNOSIS — O0990 Supervision of high risk pregnancy, unspecified, unspecified trimester: Secondary | ICD-10-CM

## 2019-12-11 DIAGNOSIS — Z3A16 16 weeks gestation of pregnancy: Secondary | ICD-10-CM | POA: Diagnosis not present

## 2019-12-11 DIAGNOSIS — O34219 Maternal care for unspecified type scar from previous cesarean delivery: Secondary | ICD-10-CM

## 2019-12-11 DIAGNOSIS — O0992 Supervision of high risk pregnancy, unspecified, second trimester: Secondary | ICD-10-CM | POA: Diagnosis not present

## 2019-12-11 DIAGNOSIS — O09899 Supervision of other high risk pregnancies, unspecified trimester: Secondary | ICD-10-CM

## 2019-12-11 DIAGNOSIS — Z148 Genetic carrier of other disease: Secondary | ICD-10-CM

## 2019-12-11 DIAGNOSIS — O09892 Supervision of other high risk pregnancies, second trimester: Secondary | ICD-10-CM

## 2019-12-11 DIAGNOSIS — Z98891 History of uterine scar from previous surgery: Secondary | ICD-10-CM

## 2019-12-11 DIAGNOSIS — O99210 Obesity complicating pregnancy, unspecified trimester: Secondary | ICD-10-CM

## 2019-12-11 MED ORDER — BLOOD PRESSURE KIT DEVI: 1.0000 | Freq: Once | 0 refills | Status: AC

## 2019-12-11 NOTE — Patient Instructions (Signed)

## 2019-12-11 NOTE — Progress Notes (Signed)
I connected with Linda Holmes 12/11/19 at 10:15 AM EDT by: MyChart video and verified that I am speaking with the correct person using two identifiers.  Patient is located at home and provider is located at [facility]     The purpose of this virtual visit is to provide medical care while limiting exposure to the novel coronavirus. I discussed the limitations, risks, security and privacy concerns of performing an evaluation and management service by MyChart video and the availability of in person appointments. I also discussed with the patient that there may be a patient responsible charge related to this service. By engaging in this virtual visit, you consent to the provision of healthcare.  Additionally, you authorize for your insurance to be billed for the services provided during this visit.  The patient expressed understanding and agreed to proceed.  The following staff members participated in the virtual visit:  Louisa Second, RN    PRENATAL VISIT NOTE  Subjective:  Linda Holmes is a 29 y.o. G2P1001 at [redacted]w[redacted]d for phone visit for ongoing prenatal care.  She is currently monitored for the following issues for this low-risk pregnancy and has Thalassemia trait; History of cesarean delivery; Supervision of high risk pregnancy, antepartum; Short interval between pregnancies affecting pregnancy, antepartum; and Obesity in pregnancy on their problem list.  Patient reports no complaints.  Contractions: Not present. Vag. Bleeding: None.  Movement: Absent. Denies leaking of fluid.   The following portions of the patient's history were reviewed and updated as appropriate: allergies, current medications, past family history, past medical history, past social history, past surgical history and problem list.   Objective:  There were no vitals filed for this visit. Self-Obtained  Fetal Status:     Movement: Absent     Assessment and Plan:  Pregnancy: G2P1001 at 146w4d. Supervision of high risk  pregnancy, antepartum - Blood Pressure Monitoring (BLOOD PRESSURE KIT) DEVI; 1 Device by Does not apply route once for 1 dose.  Dispense: 1 each; Refill: 0 - Previous BP cuff has stopped working from last pregnancy - Dicussed AFP at next visit   2. Short interval between pregnancies affecting pregnancy, antepartum  3. Obesity in pregnancy  4. History of cesarean delivery - Planing TOLAC, will sign consent at 28 weeks   5. Thalassemia trait - Patient aware and has information for partner testing   6. [redacted] weeks gestation of pregnancy  Preterm labor symptoms and general obstetric precautions including but not limited to vaginal bleeding, contractions, leaking of fluid and fetal movement were reviewed in detail with the patient.  Return in about 4 weeks (around 01/08/2020) for LOB, In-Person.  Future Appointments  Date Time Provider [place]10/27/2021  8:45 AM [facility] NURSE [facility]  9:00 AM WMC-MFC US1 W[facility]-MFCUS [facility]   Time spent on virtual visit: 10 minutes  JuKerry HoughPA-C

## 2019-12-11 NOTE — Progress Notes (Signed)
I connected with  Linda Holmes on 12/11/19 at 0950 by telephone and verified that I am speaking with the correct person using two identifiers.   I discussed the limitations, risks, security and privacy concerns of performing an evaluation and management service by telephone and the availability of in person appointments. I also discussed with the patient that there may be a patient responsible charge related to this service. The patient expressed understanding and agreed to proceed.  Marjo Bicker, RN 12/11/2019  [phone]

## 2019-12-24 ENCOUNTER — Encounter: Payer: Self-pay | Admitting: Medical

## 2019-12-28 ENCOUNTER — Other Ambulatory Visit: Payer: Self-pay | Admitting: *Deleted

## 2019-12-28 DIAGNOSIS — O219 Vomiting of pregnancy, unspecified: Secondary | ICD-10-CM

## 2019-12-28 MED ORDER — PROMETHAZINE HCL 25 MG PO TABS: 25.0000 mg | ORAL_TABLET | Freq: Four times a day (QID) | ORAL | 0 refills | Status: DC | PRN

## 2019-12-30 ENCOUNTER — Ambulatory Visit: Payer: Medicaid Other | Attending: Medical

## 2019-12-30 ENCOUNTER — Other Ambulatory Visit: Payer: Self-pay | Admitting: *Deleted

## 2019-12-30 ENCOUNTER — Other Ambulatory Visit: Payer: Self-pay

## 2019-12-30 ENCOUNTER — Ambulatory Visit: Payer: Medicaid Other | Admitting: *Deleted

## 2019-12-30 ENCOUNTER — Encounter: Payer: Self-pay | Admitting: *Deleted

## 2019-12-30 DIAGNOSIS — O09899 Supervision of other high risk pregnancies, unspecified trimester: Secondary | ICD-10-CM

## 2019-12-30 DIAGNOSIS — O99210 Obesity complicating pregnancy, unspecified trimester: Secondary | ICD-10-CM | POA: Diagnosis not present

## 2019-12-30 DIAGNOSIS — O0990 Supervision of high risk pregnancy, unspecified, unspecified trimester: Secondary | ICD-10-CM | POA: Diagnosis not present

## 2019-12-30 DIAGNOSIS — Z3490 Encounter for supervision of normal pregnancy, unspecified, unspecified trimester: Secondary | ICD-10-CM | POA: Diagnosis not present

## 2019-12-30 DIAGNOSIS — Z98891 History of uterine scar from previous surgery: Secondary | ICD-10-CM | POA: Insufficient documentation

## 2019-12-30 DIAGNOSIS — Z362 Encounter for other antenatal screening follow-up: Secondary | ICD-10-CM

## 2019-12-30 NOTE — Progress Notes (Unsigned)
Us/

## 2020-01-08 DIAGNOSIS — Z8279 Family history of other congenital malformations, deformations and chromosomal abnormalities: Secondary | ICD-10-CM | POA: Diagnosis not present

## 2020-01-08 DIAGNOSIS — Z3A20 20 weeks gestation of pregnancy: Secondary | ICD-10-CM | POA: Diagnosis not present

## 2020-01-12 ENCOUNTER — Ambulatory Visit (INDEPENDENT_AMBULATORY_CARE_PROVIDER_SITE_OTHER): Payer: Medicaid Other | Admitting: Advanced Practice Midwife

## 2020-01-12 ENCOUNTER — Other Ambulatory Visit: Payer: Self-pay

## 2020-01-12 ENCOUNTER — Encounter: Payer: Self-pay | Admitting: Advanced Practice Midwife

## 2020-01-12 VITALS — BP 122/60 | HR 92 | Wt 193.9 lb

## 2020-01-12 DIAGNOSIS — Z23 Encounter for immunization: Secondary | ICD-10-CM | POA: Diagnosis not present

## 2020-01-12 DIAGNOSIS — O0990 Supervision of high risk pregnancy, unspecified, unspecified trimester: Secondary | ICD-10-CM

## 2020-01-12 DIAGNOSIS — Z3A21 21 weeks gestation of pregnancy: Secondary | ICD-10-CM

## 2020-01-12 NOTE — Progress Notes (Signed)
   PRENATAL VISIT NOTE  Subjective:  Linda Holmes is a 29 y.o. G2P1001 at [redacted]w[redacted]d being seen today for ongoing prenatal care.  She is currently monitored for the following issues for this low-risk pregnancy and has Thalassemia trait; History of cesarean delivery; Supervision of high risk pregnancy, antepartum; Short interval between pregnancies affecting pregnancy, antepartum; and Obesity in pregnancy on their problem list.  Patient reports no complaints.  Contractions: Not present. Vag. Bleeding: None.  Movement: Present. Denies leaking of fluid.   The following portions of the patient's history were reviewed and updated as appropriate: allergies, current medications, past family history, past medical history, past social history, past surgical history and problem list.   Objective:   Vitals:   01/12/20 1048  BP: 122/60  Pulse: 92  Weight: 193 lb 14.4 oz (88 kg)    Fetal Status: Fetal Heart Rate (bpm): 151   Movement: Present     General:  Alert, oriented and cooperative. Patient is in no acute distress.  Skin: Skin is warm and dry. No rash noted.   Cardiovascular: Normal heart rate noted  Respiratory: Normal respiratory effort, no problems with respiration noted  Abdomen: Soft, gravid, appropriate for gestational age.  Pain/Pressure: Absent     Pelvic: Cervical exam deferred        Extremities: Normal range of motion.  Edema: None  Mental Status: Normal mood and affect. Normal behavior. Normal judgment and thought content.   Assessment and Plan:  Pregnancy: G2P1001 at [redacted]w[redacted]d 1. Supervision of high risk pregnancy, antepartum   2. [redacted] weeks gestation of pregnancy - AFP, Serum, Open Spina Bifida  Preterm labor symptoms and general obstetric precautions including but not limited to vaginal bleeding, contractions, leaking of fluid and fetal movement were reviewed in detail with the patient. Please refer to After Visit Summary for other counseling recommendations.   Return in about  4 weeks (around 02/09/2020).  Future Appointments  Date Time Provider Department Center  01/27/2020  8:15 AM [facility] NURSE [facility]  01/27/2020  8:30 AM [facility] US3 [facility]    Thressa Sheller DNP, CNM  01/12/20  11:00 AM

## 2020-01-12 NOTE — Patient Instructions (Signed)
COVID-19 Vaccination if You Are Pregnant or Breastfeeding ° °The Society for Maternal-Fetal Medicine (SMFM) and other pregnancy experts recommend that pregnant and lactating people be vaccinated against COVID-19. The Centers for Disease Control and Prevention (CDC) also recommend vaccination for “all people aged 28 years and older, including people who are pregnant, breastfeeding, trying to get pregnant now, or might become pregnant in the future.” Vaccination is the best way to reduce the risks of COVID-19 infection and COVID-related complications for both you and your baby. ° °Three vaccines are available to prevent COVID-19: °• The two-dose Pfizer vaccine for people 12 years and older--APPROVED by the [place] Food and Drug Administration on October 26, 2019 °• The two-dose Moderna vaccine for people 18 years and older--AUTHORIZED for emergency use °• The one-dose Johnson & Johnson vaccine for people 18 years and older (you may also see this vaccine referred to as the “Janssen vaccine”)--AUTHORIZED for emergency use ° °For those receiving the Pfizer and Moderna vaccines, the second dose is given 21 days (Pfizer) and 28 days (Moderna) after the first dose. The Johnson & Johnson vaccine is only one dose. ° °Information for Pregnant Individuals °If you are pregnant or planning to become pregnant and are thinking about getting vaccinated, consider talking with your health care professional about the vaccine.  ° °To help with your decision, you should consider the following key points: °Anyone can get the COVID vaccines free of charge regardless of immigration status or whether they have insurance. You may be asked for your social security number, but it is  °NOT required to get vaccinated. ° °What are benefits of getting the COVID-19 vaccines during pregnancy?  °• The vaccines can help protect you from getting COVID-19. With the two-dose vaccines, you must get both doses for maximum effectiveness. It’s not yet known how  long protection lasts. ° °• Another potential benefit is that getting the vaccine while pregnant may help you pass antiCOVID-19 antibodies to your baby. In numerous studies of vaccinated moms, antibodies were found in the umbilical cord blood of babies and in the mother’s breastmilk. ° °• The CDC, along with other federal partners, are monitoring people who have been vaccinated for serious side effects. So far, more than 139,000 pregnant people have been °vaccinated. No unexpected pregnancy or fetal problems have occurred. There have been no reports of any increased risk of pregnancy loss, growth problems, or birth defects. ° °• A safe vaccine is generally considered one in which the benefits of being vaccinated outweigh the risks. The current vaccines are not live vaccines. There is only a very small  °chance that they cross the placenta, so it’s unlikely that they even reach the fetus. Vaccines don’t affect future fertility. The only people who should NOT get vaccinated are those who have had a severe allergic reaction to vaccines in the past or any vaccine ingredients. ° °• Side effects may occur in the first 3 days after getting vaccinated.1 These include mild to moderate fever, headache, and muscle aches. Side effects may be worse after the second dose of the Pfizer and Moderna vaccines. Fever should be avoided during pregnancy,especially in the first trimester. Those who develop a fever after vaccination can take °acetaminophen (Tylenol). This medication is safe to use during pregnancy and does not  affect how the vaccine works.  ° °What are the known risks of getting COVID-19 during pregnancy?  °About 1 to 3 per 1,000 pregnant women with COVID-19 will develop severe disease. Compared with those who   aren’t pregnant, pregnant people infected by the COVID-19 virus: °• Are 3 times more likely to need ICU care °• Are 2 to 3 times more likely to need advanced life support and a breathing tube  °• Have a small  increased risk of dying due to COVID-19 °They may also be at increased risk of stillbirth and preterm birth. ° °What is my risk of getting COVID-19?  °Your risk of getting COVID-19 depends on the chance that you will come into contact with another infected person. The risk may be higher if you live in a community where there is a lot of COVID-19 infection or work in healthcare or another high-contact setting.  ° °What is my risk for severe complications if I get COVID-19?  °Data show that older pregnant women; those with preexisting health conditions, such as a body mass index higher than 35 kg/m2, diabetes, and heart disorders; and Black or Latinx women have an especially increased risk of severe disease and death from COVID-19. °  °If you still have questions about the vaccines or need more information, ask your health care provider or go to the Centers for Disease Control and Prevention’s COVID-19 vaccine webpage.  ° °An Update on the Johnson & Johnson Vaccine  ° °In April 2021, the FDA and CDC called for a brief pause to use of the Johnson & Johnson vaccine. They did so after reports of a severe side effect in a very small number of women younger than age 50 following vaccination. This side effect, called thrombosis with thrombocytopenia syndrome (TTS), causes blood clots (thrombosis) combined with low levels of platelets (thrombocytopenia). ° °TTS following the Johnson & Johnson vaccine is extremely rare. At the time of this update, it has occurred in only 7 people per 1 million Johnson & Johnson shots given. According to the CDC, being on hormonal birth control (the pill, patch, or ring), pregnancy, breastfeeding, or being recently pregnant does not make you more likely to develop TTS after getting the Johnson & Johnson vaccine. The pause was lifted on June 26, 2019, after the FDA and CDC determined that the known benefits of the Johnson & Johnson vaccine far outweigh the risks.  ° °Health care professionals  have been alerted to the possibility of this side effect in people who have received the Johnson & Johnson vaccine. National organizations continue to recommend COVID-19 vaccination with any of the vaccines for pregnant women. All women younger than age 50 years, whether pregnant, breastfeeding, or not, should be aware of the very rare risk of TTS after getting the Johnson & Johnson vaccine. The Pfizer and Moderna vaccines don’t have this risk. If you get the Johnson & Johnson vaccine,  °seek medical help right away if you develop any of the following symptoms within 3 weeks of getting your shot: ° °• Severe or persistent headaches or blurred vision °• Shortness of breath °• Chest pain °• Leg swelling °• Persistent abdominal pain °• Easy bruising or tiny blood spots under the skin beyond the injection site ° °Experts continue to collect health and safety information from pregnant people who have been vaccinated. If you have questions about vaccination during pregnancy, visit the CDC website or talk to your health care professional. Information for Breastfeeding/Lactating Individuals The Society for Maternal-Fetal Medicine and other pregnancy experts recommend COVID-19 vaccination for people who are breastfeeding/lactating. You don’t have to delay or stop  °breastfeeding just because you get vaccinated.  ° °Getting Vaccinated  °You can get vaccinated at   any time during pregnancy. The CDC is committed to monitoring the vaccine’s safety for all individuals. Your health professional or vaccine clinic may give you information about enrolling in the v-safe after vaccination health checker (see the box below).Even after you’re fully vaccinated, it is important to follow the CDC’s guidance for wearing a mask indoors in areas where there are substantial or high rates of COVID-19 infection.  ° °What Happens When You Enroll in v-Safe?  °The v-safe after vaccination health checker program lets the CDC check in with you after  your vaccination. At sign-up, you can indicate that you are pregnant. Once you do that, expect the following: °• Someone may call you from the v-safe program to ask initial questions and get more information. °• You may be asked to enroll in the vaccine pregnancy registry, which is collecting information about any effects of the vaccine during pregnancy. This is a great way to help scientists monitor the vaccine’s safety and effectiveness.  ° °References °1. Oliver SE, Gargano JW, Marin M, Wallace M, Curran KG, Chamberland M, et al. The Advisory Committee on Immunization Practices’ Interim Recommendation for Use of Pfizer-BioNTech  °COVID-19 Vaccine -- [place], December 2020. MMWR Morbidity and Mortality Weekly Report 2020;69. °2. FDA Briefing Document. Janssen Ad26.COV2.S Vaccine for the Prevention of COVID-19. 2021. Accessed May 08, 2019; Available from: https://www.fda.gov/media/146217/download °3. PFIZER-BIONTECH COVID-19 VACCINE [package insert] [place]: Pfizer and [place], [place]: Biontech;2020. °4. FDA Briefing Document. Moderna COVID-19 Vaccine. 2020. Accessed 2020, Dec 18; Available from: https://www.fda.gov/media/144434/download  °5. Gray KJ, Bordt EA, Atyeo C, Deriso E, Akinwunmi B, Young N, et al. COVID-19 vaccine response in pregnant and lactating women: a cohort study. Am J Obstet Gynecol 2021 Mar 24. °6. Panagiotakopoulos L, Myers TR, Gee J, Lipkind HS, Kharbanda EO, Ryan DS, et al. SARS-CoV-2 Infection Among Hospitalized Pregnant Women: Reasons for Admission and Pregnancy  °Characteristics - [facility], March 1-Aug 02, 2018. MMWR Morb Mortal Wkly Rep 2020 Sep 23;69(38):1355-9. °7. Zambrano LD, Ellington S, Strid P, Galang RR, Oduyebo T, Tong VT, et al. Update: Characteristics of Symptomatic Women of Reproductive Age with Laboratory-Confirmed SARSCoV-2 Infection by Pregnancy Status - [place], January 22-December 06, 2018. MMWR Morb Mortal Wkly Rep 2020 Nov  6;69(44):1641-7. °8. Delahoy MJ, Whitaker M, O'Halloran A, Chai SJ, Kirley PD, Alden N, et al. Characteristics and Maternal and Birth Outcomes of Hospitalized Pregnant Women with Laboratory-Confirmed COVID-19 - COVID-NET, 13 States, March 1-October 25, 2018. MMWR Morb Mortal Wkly Rep 2020 Sep 25;69(38):1347-54. ° °

## 2020-01-14 LAB — AFP, SERUM, OPEN SPINA BIFIDA
AFP MoM: 1.26
AFP Value: 62.8 ng/mL
Gest. Age on Collection Date: 21.1 weeks
Maternal Age At EDD: 28.9 yr
OSBR Risk 1 IN: 3230
Test Results:: NEGATIVE
Weight: 193 [lb_av]

## 2020-01-27 ENCOUNTER — Other Ambulatory Visit: Payer: Self-pay

## 2020-01-27 ENCOUNTER — Ambulatory Visit: Payer: Medicaid Other | Admitting: *Deleted

## 2020-01-27 ENCOUNTER — Ambulatory Visit: Payer: Medicaid Other | Attending: Obstetrics and Gynecology

## 2020-01-27 ENCOUNTER — Encounter: Payer: Self-pay | Admitting: *Deleted

## 2020-01-27 DIAGNOSIS — O09892 Supervision of other high risk pregnancies, second trimester: Secondary | ICD-10-CM

## 2020-01-27 DIAGNOSIS — O34219 Maternal care for unspecified type scar from previous cesarean delivery: Secondary | ICD-10-CM | POA: Diagnosis not present

## 2020-01-27 DIAGNOSIS — O0990 Supervision of high risk pregnancy, unspecified, unspecified trimester: Secondary | ICD-10-CM | POA: Diagnosis not present

## 2020-01-27 DIAGNOSIS — Z362 Encounter for other antenatal screening follow-up: Secondary | ICD-10-CM | POA: Diagnosis not present

## 2020-01-27 DIAGNOSIS — Z148 Genetic carrier of other disease: Secondary | ICD-10-CM

## 2020-01-27 DIAGNOSIS — Z3A23 23 weeks gestation of pregnancy: Secondary | ICD-10-CM | POA: Diagnosis not present

## 2020-01-27 DIAGNOSIS — O99210 Obesity complicating pregnancy, unspecified trimester: Secondary | ICD-10-CM

## 2020-01-27 DIAGNOSIS — O09899 Supervision of other high risk pregnancies, unspecified trimester: Secondary | ICD-10-CM | POA: Diagnosis not present

## 2020-02-09 ENCOUNTER — Other Ambulatory Visit: Payer: Self-pay

## 2020-02-09 ENCOUNTER — Ambulatory Visit (INDEPENDENT_AMBULATORY_CARE_PROVIDER_SITE_OTHER): Payer: Medicaid Other | Admitting: Nurse Practitioner

## 2020-02-09 VITALS — BP 111/69 | HR 95 | Wt 197.3 lb

## 2020-02-09 DIAGNOSIS — Z3A25 25 weeks gestation of pregnancy: Secondary | ICD-10-CM

## 2020-02-09 DIAGNOSIS — O0990 Supervision of high risk pregnancy, unspecified, unspecified trimester: Secondary | ICD-10-CM | POA: Diagnosis not present

## 2020-02-09 DIAGNOSIS — Z98891 History of uterine scar from previous surgery: Secondary | ICD-10-CM

## 2020-02-09 NOTE — Progress Notes (Signed)
Medicaid Home Form Completed-02/09/20 

## 2020-02-09 NOTE — Patient Instructions (Signed)
BP cuff from [facility]

## 2020-02-09 NOTE — Progress Notes (Signed)
    Subjective:  Linda Holmes is a 29 y.o. G2P1001 at [redacted]w[redacted]d being seen today for ongoing prenatal care.  She is currently monitored for the following issues for this high-risk pregnancy and has Thalassemia trait; History of cesarean delivery; Supervision of high risk pregnancy, antepartum; Short interval between pregnancies affecting pregnancy, antepartum; and Obesity in pregnancy on their problem list.  Patient reports no complaints.  Contractions: Not present. Vag. Bleeding: None.  Movement: Present. Denies leaking of fluid.   The following portions of the patient's history were reviewed and updated as appropriate: allergies, current medications, past family history, past medical history, past social history, past surgical history and problem list. Problem list updated.  Objective:   Vitals:   02/09/20 1005  BP: 111/69  Pulse: 95  Weight: 197 lb 4.8 oz (89.5 kg)    Fetal Status: Fetal Heart Rate (bpm): 146 Fundal Height: 26 cm Movement: Present     General:  Alert, oriented and cooperative. Patient is in no acute distress.  Skin: Skin is warm and dry. No rash noted.   Cardiovascular: Normal heart rate noted  Respiratory: Normal respiratory effort, no problems with respiration noted  Abdomen: Soft, gravid, appropriate for gestational age. Pain/Pressure: Absent     Pelvic:  Cervical exam deferred        Extremities: Normal range of motion.  Edema: None  Mental Status: Normal mood and affect. Normal behavior. Normal judgment and thought content.   Urinalysis:      Assessment and Plan:  Pregnancy: G2P1001 at [redacted]w[redacted]d  1. Supervision of high risk pregnancy, antepartum Plans to get BP cuff today Has babyscripts on phone Does not want any contraception postpartum - this was a surprise pregnancy but she declines any contraception postpartum  2. History of cesarean delivery Plans TOLAC - will schedule to meet with MD at next visit to sign consent  Preterm labor symptoms and general  obstetric precautions including but not limited to vaginal bleeding, contractions, leaking of fluid and fetal movement were reviewed in detail with the patient. Please refer to After Visit Summary for other counseling recommendations.  Return in about 3 weeks (around 03/01/2020) for Needs appt with MD for delivery plan and early AM appt for fasting labs.  Nolene Bernheim, RN, MSN, NP-BC Nurse Practitioner, [facility] for [employer], [facility] 02/09/2020 10:41 AM

## 2020-02-11 ENCOUNTER — Encounter: Payer: Self-pay | Admitting: Lactation Services

## 2020-02-29 ENCOUNTER — Ambulatory Visit (INDEPENDENT_AMBULATORY_CARE_PROVIDER_SITE_OTHER): Payer: Medicaid Other | Admitting: Obstetrics & Gynecology

## 2020-02-29 ENCOUNTER — Other Ambulatory Visit: Payer: Self-pay

## 2020-02-29 VITALS — BP 107/61 | HR 89 | Wt 195.9 lb

## 2020-02-29 DIAGNOSIS — Z23 Encounter for immunization: Secondary | ICD-10-CM

## 2020-02-29 DIAGNOSIS — Z98891 History of uterine scar from previous surgery: Secondary | ICD-10-CM

## 2020-02-29 DIAGNOSIS — O09892 Supervision of other high risk pregnancies, second trimester: Secondary | ICD-10-CM

## 2020-02-29 DIAGNOSIS — O0990 Supervision of high risk pregnancy, unspecified, unspecified trimester: Secondary | ICD-10-CM | POA: Diagnosis not present

## 2020-02-29 DIAGNOSIS — O0992 Supervision of high risk pregnancy, unspecified, second trimester: Secondary | ICD-10-CM | POA: Diagnosis not present

## 2020-02-29 DIAGNOSIS — Z3A28 28 weeks gestation of pregnancy: Secondary | ICD-10-CM

## 2020-02-29 DIAGNOSIS — O09899 Supervision of other high risk pregnancies, unspecified trimester: Secondary | ICD-10-CM

## 2020-02-29 NOTE — Patient Instructions (Signed)
Vaginal Birth After Cesarean Delivery  Vaginal birth after cesarean delivery (VBAC) is giving birth vaginally after previously delivering a baby through a cesarean section (C-section). A VBAC may be a safe option for you, depending on your health and other factors. It is important to discuss VBAC with your health care provider early in your pregnancy so you can understand the risks, benefits, and options. Having these discussions early will give you time to make your birth plan. Who are the best candidates for VBAC? The best candidates for VBAC are women who:  Have had one or two prior cesarean deliveries, and the incision made during the delivery was horizontal (low transverse).  Do not have a vertical (classical) scar on their uterus.  Have not had a tear in the wall of their uterus (uterine rupture).  Plan to have more pregnancies. A VBAC is also more likely to be successful:  In women who have previously given birth vaginally.  When labor starts by itself (spontaneously) before the due date. What are the benefits of VBAC? The benefits of delivering your baby vaginally instead of by a cesarean delivery include:  A shorter hospital stay.  A faster recovery time.  Less pain.  Avoiding risks associated with major surgery, such as infection and blood clots.  Less blood loss and less need for donated blood (transfusions). What are the risks of VBAC? The main risk of attempting a VBAC is that it may fail, forcing your health care provider to deliver your baby by a C-section. Other risks are rare and include:  Tearing (rupture) of the scar from a past cesarean delivery.  Other risks associated with vaginal deliveries. If a repeat cesarean delivery is needed, the risks include:  Blood loss.  Infection.  Blood clot.  Damage to surrounding organs.  Removal of the uterus (hysterectomy), if it is damaged.  Placenta problems in future pregnancies. What else should I know  about my options? Delivering a baby through a VBAC is similar to having a normal spontaneous vaginal delivery. Therefore, it is safe:  To try with twins.  For your health care provider to try to turn the baby from a breech position (external cephalic version) during labor.  With epidural analgesia for pain relief. Consider where you would like to deliver your baby. VBAC should be attempted in facilities where an emergency cesarean delivery can be performed. VBAC is not recommended for home births. Any changes in your health or your baby's health during your pregnancy may make it necessary to change your initial decision about VBAC. Your health care provider may recommend that you do not attempt a VBAC if:  Your baby's suspected weight is 8.8 lb (4 kg) or more.  You have preeclampsia. This is a condition that causes high blood pressure along with other symptoms, such as swelling and headaches.  You will have VBAC less than 19 months after your cesarean delivery.  You are past your due date.  You need to have labor started (induced) because your cervix is not ready for labor (unfavorable). Where to find more information  American Pregnancy Association: americanpregnancy.org  American Congress of Obstetricians and Gynecologists: acog.org Summary  Vaginal birth after cesarean delivery (VBAC) is giving birth vaginally after previously delivering a baby through a cesarean section (C-section). A VBAC may be a safe option for you, depending on your health and other factors.  Discuss VBAC with your health care provider early in your pregnancy so you can understand the risks, benefits, options, and   have plenty of time to make your birth plan.  The main risk of attempting a VBAC is that it may fail, forcing your health care provider to deliver your baby by a C-section. Other risks are rare. This information is not intended to replace advice given to you by your health care provider. Make sure  you discuss any questions you have with your health care provider. Document Revised: 06/17/2018 Document Reviewed: 05/29/2016 Elsevier Patient Education  2020 Elsevier Inc.  

## 2020-02-29 NOTE — Progress Notes (Signed)
   PRENATAL VISIT NOTE  Subjective:  Linda Holmes is a 29 y.o. G2P1001 at [redacted]w[redacted]d being seen today for ongoing prenatal care.  She is currently monitored for the following issues for this high-risk pregnancy and has Thalassemia trait; History of cesarean delivery; Supervision of high risk pregnancy, antepartum; Short interval between pregnancies affecting pregnancy, antepartum; and Obesity in pregnancy on their problem list.  Patient reports no complaints.  Contractions: Not present. Vag. Bleeding: None.  Movement: Present. Denies leaking of fluid.   The following portions of the patient's history were reviewed and updated as appropriate: allergies, current medications, past family history, past medical history, past social history, past surgical history and problem list.   Objective:   Vitals:   02/29/20 0830  BP: 107/61  Pulse: 89  Weight: 195 lb 14.4 oz (88.9 kg)    Fetal Status:     Movement: Present     General:  Alert, oriented and cooperative. Patient is in no acute distress.  Skin: Skin is warm and dry. No rash noted.   Cardiovascular: Normal heart rate noted  Respiratory: Normal respiratory effort, no problems with respiration noted  Abdomen: Soft, gravid, appropriate for gestational age.  Pain/Pressure: Absent     Pelvic: Cervical exam deferred        Extremities: Normal range of motion.  Edema: None  Mental Status: Normal mood and affect. Normal behavior. Normal judgment and thought content.   Assessment and Plan:  Pregnancy: G2P1001 at [redacted]w[redacted]d 1. Supervision of high risk pregnancy, antepartum Routine screening - Glucose Tolerance, 2 Hours w/1 Hour - RPR - HIV Antibody (routine testing w rflx) - CBC - Tdap vaccine greater than or equal to 7yo IM  2. History of cesarean delivery Plans TOLAC, reviewed consent and signed today  3. Short interval between pregnancies affecting pregnancy, antepartum   Preterm labor symptoms and general obstetric precautions including  but not limited to vaginal bleeding, contractions, leaking of fluid and fetal movement were reviewed in detail with the patient. Please refer to After Visit Summary for other counseling recommendations.   Return in about 4 weeks (around 03/28/2020).  No future appointments.  Scheryl Darter, MD

## 2020-03-01 LAB — HIV ANTIBODY (ROUTINE TESTING W REFLEX): HIV Screen 4th Generation wRfx: NONREACTIVE

## 2020-03-01 LAB — CBC
Hematocrit: 31.3 % — ABNORMAL LOW (ref 34.0–46.6)
Hemoglobin: 9.8 g/dL — ABNORMAL LOW (ref 11.1–15.9)
MCH: 22.5 pg — ABNORMAL LOW (ref 26.6–33.0)
MCHC: 31.3 g/dL — ABNORMAL LOW (ref 31.5–35.7)
MCV: 72 fL — ABNORMAL LOW (ref 79–97)
Platelets: 346 10*3/uL (ref 150–450)
RBC: 4.35 x10E6/uL (ref 3.77–5.28)
RDW: 15.1 % (ref 11.7–15.4)
WBC: 8.2 10*3/uL (ref 3.4–10.8)

## 2020-03-01 LAB — GLUCOSE TOLERANCE, 2 HOURS W/ 1HR
Glucose, 1 hour: 115 mg/dL (ref 65–179)
Glucose, 2 hour: 95 mg/dL (ref 65–152)
Glucose, Fasting: 86 mg/dL (ref 65–91)

## 2020-03-01 LAB — RPR: RPR Ser Ql: NONREACTIVE

## 2020-03-13 ENCOUNTER — Inpatient Hospital Stay (HOSPITAL_COMMUNITY)
Admission: AD | Admit: 2020-03-13 | Discharge: 2020-03-13 | Disposition: A | Discharge status: Home / Self Care | Payer: Medicaid Other | Attending: Obstetrics and Gynecology | Admitting: Obstetrics and Gynecology

## 2020-03-13 ENCOUNTER — Other Ambulatory Visit: Payer: Self-pay

## 2020-03-13 ENCOUNTER — Encounter (HOSPITAL_COMMUNITY): Payer: Self-pay | Admitting: Obstetrics and Gynecology

## 2020-03-13 ENCOUNTER — Inpatient Hospital Stay (HOSPITAL_BASED_OUTPATIENT_CLINIC_OR_DEPARTMENT_OTHER): Payer: Medicaid Other

## 2020-03-13 DIAGNOSIS — O4693 Antepartum hemorrhage, unspecified, third trimester: Secondary | ICD-10-CM | POA: Diagnosis not present

## 2020-03-13 DIAGNOSIS — Z3A29 29 weeks gestation of pregnancy: Secondary | ICD-10-CM | POA: Diagnosis not present

## 2020-03-13 LAB — WET PREP, GENITAL
Sperm: NONE SEEN
Trich, Wet Prep: NONE SEEN
Yeast Wet Prep HPF POC: NONE SEEN

## 2020-03-13 LAB — URINALYSIS, ROUTINE W REFLEX MICROSCOPIC
Bilirubin Urine: NEGATIVE
Glucose, UA: NEGATIVE mg/dL
Ketones, ur: NEGATIVE mg/dL
Nitrite: NEGATIVE
Protein, ur: NEGATIVE mg/dL
Specific Gravity, Urine: 1.004 — ABNORMAL LOW (ref 1.005–1.030)
pH: 7 (ref 5.0–8.0)

## 2020-03-13 LAB — CBC
HCT: 33.2 % — ABNORMAL LOW (ref 36.0–46.0)
Hemoglobin: 10.4 g/dL — ABNORMAL LOW (ref 12.0–15.0)
MCH: 22.7 pg — ABNORMAL LOW (ref 26.0–34.0)
MCHC: 31.3 g/dL (ref 30.0–36.0)
MCV: 72.3 fL — ABNORMAL LOW (ref 80.0–100.0)
Platelets: 251 10*3/uL (ref 150–400)
RBC: 4.59 MIL/uL (ref 3.87–5.11)
RDW: 15.7 % — ABNORMAL HIGH (ref 11.5–15.5)
WBC: 6 10*3/uL (ref 4.0–10.5)
nRBC: 0 % (ref 0.0–0.2)

## 2020-03-13 NOTE — MAU Note (Signed)
Linda Holmes is a 30 y.o. at [redacted]w[redacted]d here in MAU reporting: vaginal bleeding since last night. States this AM she didn't see any and then this afternoon saw some more in her panties and when she wiped. No recent IC. No pain. No LOF. +FM  Onset of complaint: last night  Pain score: 0/10  Vitals:   03/13/20 1805  BP: 113/64  Pulse: 97  Resp: 16  Temp: 97.7 F (36.5 C)  SpO2: 99%     FHT: +FM  Lab orders placed from triage: UA

## 2020-03-13 NOTE — MAU Provider Note (Cosign Needed Addendum)
History    CSN: [id number]  Arrival date and time: 03/13/20 1754   Event Date/Time   First Provider Initiated Contact with Patient 03/13/20 1828    Chief Complaint  Patient presents with  . Vaginal Bleeding   HPI Linda Holmes is a 30 yo G2P1001 at [redacted]w[redacted]d presenting with vaginal bleeding. She reports the bleeding began yesterday and has slightly increased in amount since. She endorses that it is bright red without clots. She first noticed it when she was wiping. Now she reports that she has a pad that she has been using for the past hour and a small 2-3cm stripe of blood is on it. She denies abnormal vaginal discharge, abdominal pain, and recent sexual intercourse. She does report another episode of bleeding during this pregnancy around 27wks that was light spotting and resolved within a day. She also reports a hx of chlamydia and gonorrhea in her previous pregnancy and has not been tested recently.   OB History    Gravida  2   Para  1   Term  1   Preterm      AB      Living  1     SAB      IAB      Ectopic      Multiple  0   Live Births  1           Past Medical History:  Diagnosis Date  . Anemia     Past Surgical History:  Procedure Laterality Date  . CESAREAN SECTION N/A 11/24/2018   Procedure: CESAREAN SECTION;  Surgeon: Catalina Antigua, MD;  Location: [facility];  Service: Obstetrics;  Laterality: N/A;    Family History  Problem Relation Age of Onset  . Hyperlipidemia Mother   . Bipolar disorder Brother   . Asthma Daughter     Social History   Tobacco Use  . Smoking status: Never Smoker  . Smokeless tobacco: Never Used  Vaping Use  . Vaping Use: Never used  Substance Use Topics  . Alcohol use: Not Currently    Comment: every weekend  . Drug use: Never    Allergies: No Known Allergies  Medications Prior to Admission  Medication Sig Dispense Refill Last Dose  . acetaminophen (TYLENOL) 500 MG tablet Take 1,000 mg by mouth every 6 (six)  hours as needed.     Marland Kitchen aspirin EC 81 MG tablet Take 1 tablet (81 mg total) by mouth daily. Swallow whole. (Patient not taking: No sig reported) 30 tablet 11   . ferrous sulfate 325 (65 FE) MG EC tablet Take 325 mg by mouth. Once daily (Patient not taking: No sig reported)     . Prenatal Vit-Fe Fumarate-FA (MULTIVITAMIN-PRENATAL) 27-0.8 MG TABS tablet Take 1 tablet by mouth daily at 12 noon.     . promethazine (PHENERGAN) 25 MG tablet Take 1 tablet (25 mg total) by mouth every 6 (six) hours as needed for nausea or vomiting. (Patient not taking: No sig reported) 30 tablet 0     Review of Systems  Constitutional: Negative for fatigue.  Gastrointestinal: Negative for abdominal pain, blood in stool, constipation, diarrhea, nausea and vomiting.  Genitourinary: Positive for vaginal bleeding. Negative for decreased urine volume, dysuria, flank pain, hematuria, pelvic pain, urgency and vaginal discharge.  Musculoskeletal: Positive for myalgias. Negative for back pain.  Neurological: Positive for headaches.     Physical Exam   Blood pressure 113/64, pulse 97, temperature 97.7 F (36.5 C), temperature source Oral,  resp. rate 16, height 5\' 3"  (1.6 m), weight 86.7 kg, last menstrual period 08/17/2019, SpO2 99 %, currently breastfeeding.  Physical Exam Constitutional:      Appearance: Normal appearance.  HENT:     Head: Normocephalic.     Nose: No congestion.  Eyes:     Pupils: Pupils are equal, round, and reactive to light.  Cardiovascular:     Rate and Rhythm: Normal rate and regular rhythm.     Heart sounds: Normal heart sounds.  Pulmonary:     Effort: Pulmonary effort is normal.     Breath sounds: Normal breath sounds.  Abdominal:     General: Bowel sounds are normal.     Palpations: Abdomen is soft.     Tenderness: There is no abdominal tenderness.  Genitourinary:    Comments: Cervix: closed/thick Pelvic outlet is very narrow Musculoskeletal:        General: Normal range of motion.   Skin:    General: Skin is warm and dry.  Neurological:     Mental Status: She is alert and oriented to person, place, and time.  Psychiatric:        Mood and Affect: Mood normal.        Behavior: Behavior normal.        Judgment: Judgment normal.     08/19/2019: no previa or abruption identified, AFI WNL  NST:  Baseline: 130 Variability: moderate Accels: 15x15 Decels: none Toco: none Reactive/Appropriate for GA   Results for orders placed or performed during the hospital encounter of 03/13/20 (from the past 24 hour(s))  CBC     Status: Abnormal   Collection Time: 03/13/20  7:29 PM  Result Value Ref Range   WBC 6.0 4.0 - 10.5 K/uL   RBC 4.59 3.87 - 5.11 MIL/uL   Hemoglobin 10.4 (L) 12.0 - 15.0 g/dL   HCT 05/11/20 (L) 27.0 - 62.3 %   MCV 72.3 (L) 80.0 - 100.0 fL   MCH 22.7 (L) 26.0 - 34.0 pg   MCHC 31.3 30.0 - 36.0 g/dL   RDW 76.2 (H) 83.1 - 51.7 %   Platelets 251 150 - 400 K/uL   nRBC 0.0 0.0 - 0.2 %  Urinalysis, Routine w reflex microscopic Urine, Clean Catch     Status: Abnormal   Collection Time: 03/13/20  8:12 PM  Result Value Ref Range   Color, Urine STRAW (A) YELLOW   APPearance CLEAR CLEAR   Specific Gravity, Urine 1.004 (L) 1.005 - 1.030   pH 7.0 5.0 - 8.0   Glucose, UA NEGATIVE NEGATIVE mg/dL   Hgb urine dipstick MODERATE (A) NEGATIVE   Bilirubin Urine NEGATIVE NEGATIVE   Ketones, ur NEGATIVE NEGATIVE mg/dL   Protein, ur NEGATIVE NEGATIVE mg/dL   Nitrite NEGATIVE NEGATIVE   Leukocytes,Ua MODERATE (A) NEGATIVE   RBC / HPF 0-5 0 - 5 RBC/hpf   WBC, UA 0-5 0 - 5 WBC/hpf   Bacteria, UA RARE (A) NONE SEEN   Squamous Epithelial / LPF 0-5 0 - 5  Wet prep, genital     Status: Abnormal   Collection Time: 03/13/20  8:16 PM   Specimen: Vaginal  Result Value Ref Range   Yeast Wet Prep HPF POC NONE SEEN NONE SEEN   Trich, Wet Prep NONE SEEN NONE SEEN   Clue Cells Wet Prep HPF POC PRESENT (A) NONE SEEN   WBC, Wet Prep HPF POC FEW (A) NONE SEEN   Sperm NONE SEEN     MAU  Course  Procedures UA with reflex microscopic  CBC GC/chlamydia probe amp  Wet prep  Ultrasound   MDM   Assessment and Plan   1. Vaginal bleeding in pregnancy, third trimester   2. [redacted] weeks gestation of pregnancy    DC home Comfort measures reviewed  3rd Trimester precautions  Bleeding precautions PTL precautions  Fetal kick counts RX: no new RX  Return to MAU as needed FU with OB as planned   Follow-up Information    [facility] for [facility] Follow up.   Specialty: Obstetrics and Gynecology Contact information: [address] [place] [address] [phone]             Thressa Sheller DNP, CNM  03/13/20  9:28 PM

## 2020-03-13 NOTE — Discharge Instructions (Signed)

## 2020-03-14 LAB — GC/CHLAMYDIA PROBE AMP (~~LOC~~) NOT AT ARMC
Chlamydia: POSITIVE — AB
Comment: NEGATIVE
Comment: NORMAL
Neisseria Gonorrhea: POSITIVE — AB

## 2020-03-15 LAB — CULTURE, OB URINE: Culture: <10000 — AB

## 2020-03-16 ENCOUNTER — Ambulatory Visit (INDEPENDENT_AMBULATORY_CARE_PROVIDER_SITE_OTHER): Payer: Medicaid Other

## 2020-03-16 ENCOUNTER — Other Ambulatory Visit: Payer: Self-pay

## 2020-03-16 VITALS — BP 123/70 | HR 84 | Wt 192.1 lb

## 2020-03-16 DIAGNOSIS — O98219 Gonorrhea complicating pregnancy, unspecified trimester: Secondary | ICD-10-CM | POA: Diagnosis not present

## 2020-03-16 MED ORDER — AZITHROMYCIN 500 MG PO TABS
1000.0000 mg | ORAL_TABLET | Freq: Once | ORAL | Status: AC
  Administered 2020-03-16: 1000 mg via ORAL

## 2020-03-16 MED ORDER — PREPLUS 27-1 MG PO TABS: 1.0000 | ORAL_TABLET | Freq: Every day | ORAL | 6 refills | Status: DC

## 2020-03-16 MED ORDER — CEFTRIAXONE SODIUM 500 MG IJ SOLR
500.0000 mg | Freq: Once | INTRAMUSCULAR | Status: AC
  Administered 2020-03-16: 500 mg via INTRAMUSCULAR

## 2020-03-16 NOTE — Progress Notes (Signed)
Pt here today for treatment for gonorrhea and chlamydia.  Rocephin 500 mg administered IM and Zithromax 1 g po given.  Pt tolerated well.  Pt reports good fetal movement.  Pt advised to make sure that she abstain's from intercourse at least weeks after both have been treated.  Pt verbalized understanding.   Addison Naegeli, RN 03/16/20

## 2020-03-22 NOTE — Progress Notes (Signed)
Patient was assessed and managed by nursing staff during this encounter. I have reviewed the chart and agree with the documentation and plan. I have also made any necessary editorial changes.  Tiombe Tomeo, MD 03/22/2020 7:53 AM   

## 2020-03-30 ENCOUNTER — Telehealth (INDEPENDENT_AMBULATORY_CARE_PROVIDER_SITE_OTHER): Payer: Medicaid Other | Admitting: Student

## 2020-03-30 DIAGNOSIS — O0990 Supervision of high risk pregnancy, unspecified, unspecified trimester: Secondary | ICD-10-CM

## 2020-03-30 DIAGNOSIS — O09899 Supervision of other high risk pregnancies, unspecified trimester: Secondary | ICD-10-CM

## 2020-03-30 DIAGNOSIS — U071 COVID-19: Secondary | ICD-10-CM | POA: Insufficient documentation

## 2020-03-30 DIAGNOSIS — Z3A32 32 weeks gestation of pregnancy: Secondary | ICD-10-CM

## 2020-03-30 DIAGNOSIS — O98513 Other viral diseases complicating pregnancy, third trimester: Secondary | ICD-10-CM

## 2020-03-30 DIAGNOSIS — O99210 Obesity complicating pregnancy, unspecified trimester: Secondary | ICD-10-CM

## 2020-03-30 DIAGNOSIS — E669 Obesity, unspecified: Secondary | ICD-10-CM

## 2020-03-30 DIAGNOSIS — O99213 Obesity complicating pregnancy, third trimester: Secondary | ICD-10-CM

## 2020-03-30 NOTE — Progress Notes (Signed)
Called Pt for My Chart visit, Pt states that she can't enable Camera nor Sound, sent Text. Pt states still cannot access.  I connected with  Linda Holmes on 03/30/20 at 10:35 AM EST by telephone and verified that I am speaking with the correct person using two identifiers.   I discussed the limitations, risks, security and privacy concerns of performing an evaluation and management service by telephone and the availability of in person appointments. I also discussed with the patient that there may be a patient responsible charge related to this service. The patient expressed understanding and agreed to proceed.  [name], CMA 03/30/2020  11:21 AM

## 2020-03-30 NOTE — Progress Notes (Signed)
Patient ID: Linda Holmes, female   DOB: Jul 02, 1990, 30 y.o.   MRN: [id number]    TELEHEALTH OBSTETRICS VISIT ENCOUNTER NOTE  Provider location: [facility] for [facility]   I connected with Linda Holmes on 03/30/20 at 10:35 AM EST by telephone at home and verified that I am speaking with the correct person using two identifiers. Of note, unable to do video encounter due to technical difficulties.    I discussed the limitations, risks, security and privacy concerns of performing an evaluation and management service by telephone and the availability of in person appointments. I also discussed with the patient that there may be a patient responsible charge related to this service. The patient expressed understanding and agreed to proceed.  Subjective:  Linda Holmes is a [name] at [redacted]w[redacted]d being followed for ongoing prenatal care.  She is currently monitored for the following issues for this low-risk pregnancy and has Thalassemia trait; History of cesarean delivery; Supervision of high risk pregnancy, antepartum; Short interval between pregnancies affecting pregnancy, antepartum; Obesity in pregnancy; and COVID-19 on their problem list.  Patient reports cough. No difficulty breathing, SOB. .  She still wants to VBAC. Vaginal bleeding has resolved since MAU visit on 1/9. Reports fetal movement. Denies any contractions, bleeding or leaking of fluid.   The following portions of the patient's history were reviewed and updated as appropriate: allergies, current medications, past family history, past medical history, past social history, past surgical history and problem list.   Objective:  Blood pressure 118/69, pulse (!) 106, last menstrual period 08/17/2019, currently breastfeeding. General:  Alert, oriented and cooperative.   Mental Status: Normal mood and affect perceived. Normal judgment and thought content.  Rest of physical exam deferred due to type of  encounter  Assessment and Plan:  Pregnancy: G2P1001 at [redacted]w[redacted]d 1. Supervision of high risk pregnancy, antepartum -patient still desires VBAC; consent pulled from file and CMA will scan in today.  -Patient doing well otherwise, getting over COVID and denies any problems with breathing or SOB.  2. Short interval between pregnancies affecting pregnancy, antepartum   3. Obesity in pregnancy   Preterm labor symptoms and general obstetric precautions including but not limited to vaginal bleeding, contractions, leaking of fluid and fetal movement were reviewed in detail with the patient.  I discussed the assessment and treatment plan with the patient. The patient was provided an opportunity to ask questions and all were answered. The patient agreed with the plan and demonstrated an understanding of the instructions. The patient was advised to call back or seek an in-person office evaluation/go to MAU at [facility] for any urgent or concerning symptoms. Please refer to After Visit Summary for other counseling recommendations.   I provided 12 minutes of non-face-to-face time during this encounter.  Return in about 2 weeks (around 04/13/2020), or [facility] in person.  Future Appointments  Date Time Provider Department Center  04/13/2020  8:15 AM Armando Reichert, CNM [facility]    Marylene Land, CNM Center for [employer]

## 2020-03-31 ENCOUNTER — Telehealth: Payer: Self-pay | Admitting: Obstetrics and Gynecology

## 2020-03-31 NOTE — Telephone Encounter (Signed)
Patient called and said she was in the ED with elevated BP's, and was told to speak to a nurse today.

## 2020-04-01 NOTE — Telephone Encounter (Signed)
Spoke with pt. Pt is 32+[redacted] weeks pregnant today.   Pt states feeling lightheaded x 1 yesterday while working and wearing mask. Pt states works from home, but wearing mask after having Covid at home to keep son safe from getting Covid. Pt states called ER nurse to discuss elevated BP at home yesterday, but did not go to ER. Pt states once took off mask, then felt better. Pt reports BP as 130/76. Advised pt not a elevated BP and was normal. Pt verbalized understanding. Pt denies feeling dizzy, lightheaded today. Pt states thinks she felt this way due to wearing mask for longer period than was used to. Pt advised to call our office with any other concerns or needs, or go to MAU on nights and weekends to be evaluated. Pt verbalized understanding and agreeable.  Pt thankful for callback.   Judeth Cornfield, RN 04/01/20

## 2020-04-13 ENCOUNTER — Other Ambulatory Visit: Payer: Self-pay

## 2020-04-13 ENCOUNTER — Ambulatory Visit (INDEPENDENT_AMBULATORY_CARE_PROVIDER_SITE_OTHER): Payer: Medicaid Other | Admitting: Advanced Practice Midwife

## 2020-04-13 ENCOUNTER — Encounter: Payer: Self-pay | Admitting: Advanced Practice Midwife

## 2020-04-13 VITALS — BP 107/74 | HR 96 | Wt 198.0 lb

## 2020-04-13 DIAGNOSIS — Z3A34 34 weeks gestation of pregnancy: Secondary | ICD-10-CM

## 2020-04-13 DIAGNOSIS — O0990 Supervision of high risk pregnancy, unspecified, unspecified trimester: Secondary | ICD-10-CM

## 2020-04-13 NOTE — Progress Notes (Signed)
   PRENATAL VISIT NOTE  Subjective:  Linda Holmes is a [name] at [redacted]w[redacted]d being seen today for ongoing prenatal care.  She is currently monitored for the following issues for this high-risk pregnancy and has Thalassemia trait; History of cesarean delivery; Supervision of high risk pregnancy, antepartum; Short interval between pregnancies affecting pregnancy, antepartum; Obesity in pregnancy; and COVID-19 on their problem list.  Patient reports no complaints.  Contractions: Not present. Vag. Bleeding: None.  Movement: Present. Denies leaking of fluid.   The following portions of the patient's history were reviewed and updated as appropriate: allergies, current medications, past family history, past medical history, past social history, past surgical history and problem list.   Objective:   Vitals:   04/13/20 0818  BP: 107/74  Pulse: 96  Weight: 198 lb (89.8 kg)    Fetal Status: Fetal Heart Rate (bpm): 145 Fundal Height: 35 cm Movement: Present     General:  Alert, oriented and cooperative. Patient is in no acute distress.  Skin: Skin is warm and dry. No rash noted.   Cardiovascular: Normal heart rate noted  Respiratory: Normal respiratory effort, no problems with respiration noted  Abdomen: Soft, gravid, appropriate for gestational age.  Pain/Pressure: Absent     Pelvic: Cervical exam deferred        Extremities: Normal range of motion.  Edema: None  Mental Status: Normal mood and affect. Normal behavior. Normal judgment and thought content.   Assessment and Plan:  Pregnancy: G2P1001 at [redacted]w[redacted]d 1. Supervision of high risk pregnancy, antepartum - routine care - GBS and GC/CT at next visit   2. [redacted] weeks gestation of pregnancy   Preterm labor symptoms and general obstetric precautions including but not limited to vaginal bleeding, contractions, leaking of fluid and fetal movement were reviewed in detail with the patient. Please refer to After Visit Summary for other  counseling recommendations.   Return in about 2 weeks (around 04/27/2020) for in person visit with 36 weeks labs/GBS .  No future appointments.  Thressa Sheller DNP, CNM  04/13/20  8:43 AM

## 2020-04-13 NOTE — Patient Instructions (Signed)
COVID-19 Vaccination if You Are Pregnant or Breastfeeding ° °The Society for Maternal-Fetal Medicine (SMFM) and other pregnancy experts recommend that pregnant and lactating people be vaccinated against COVID-19. The Centers for Disease Control and Prevention (CDC) also recommend vaccination for “all people aged 29 years and older, including people who are pregnant, breastfeeding, trying to get pregnant now, or might become pregnant in the future.” Vaccination is the best way to reduce the risks of COVID-19 infection and COVID-related complications for both you and your baby. ° °Three vaccines are available to prevent COVID-19: °• The two-dose Pfizer vaccine for people 12 years and older--APPROVED by the [place] Food and Drug Administration on October 26, 2019 °• The two-dose Moderna vaccine for people 18 years and older--AUTHORIZED for emergency use °• The one-dose Johnson & Johnson vaccine for people 18 years and older (you may also see this vaccine referred to as the “Janssen vaccine”)--AUTHORIZED for emergency use ° °For those receiving the Pfizer and Moderna vaccines, the second dose is given 21 days (Pfizer) and 28 days (Moderna) after the first dose. The Johnson & Johnson vaccine is only one dose. ° °Information for Pregnant Individuals °If you are pregnant or planning to become pregnant and are thinking about getting vaccinated, consider talking with your health care professional about the vaccine.  ° °To help with your decision, you should consider the following key points: °Anyone can get the COVID vaccines free of charge regardless of immigration status or whether they have insurance. You may be asked for your social security number, but it is  °NOT required to get vaccinated. ° °What are benefits of getting the COVID-19 vaccines during pregnancy?  °• The vaccines can help protect you from getting COVID-19. With the two-dose vaccines, you must get both doses for maximum effectiveness. It’s not yet known how  long protection lasts. ° °• Another potential benefit is that getting the vaccine while pregnant may help you pass antiCOVID-19 antibodies to your baby. In numerous studies of vaccinated moms, antibodies were found in the umbilical cord blood of babies and in the mother’s breastmilk. ° °• The CDC, along with other federal partners, are monitoring people who have been vaccinated for serious side effects. So far, more than 139,000 pregnant people have been °vaccinated. No unexpected pregnancy or fetal problems have occurred. There have been no reports of any increased risk of pregnancy loss, growth problems, or birth defects. ° °• A safe vaccine is generally considered one in which the benefits of being vaccinated outweigh the risks. The current vaccines are not live vaccines. There is only a very small  °chance that they cross the placenta, so it’s unlikely that they even reach the fetus. Vaccines don’t affect future fertility. The only people who should NOT get vaccinated are those who have had a severe allergic reaction to vaccines in the past or any vaccine ingredients. ° °• Side effects may occur in the first 3 days after getting vaccinated.1 These include mild to moderate fever, headache, and muscle aches. Side effects may be worse after the second dose of the Pfizer and Moderna vaccines. Fever should be avoided during pregnancy,especially in the first trimester. Those who develop a fever after vaccination can take °acetaminophen (Tylenol). This medication is safe to use during pregnancy and does not  affect how the vaccine works.  ° °What are the known risks of getting COVID-19 during pregnancy?  °About 1 to 3 per 1,000 pregnant women with COVID-19 will develop severe disease. Compared with those who   aren’t pregnant, pregnant people infected by the COVID-19 virus: °• Are 3 times more likely to need ICU care °• Are 2 to 3 times more likely to need advanced life support and a breathing tube  °• Have a small  increased risk of dying due to COVID-19 °They may also be at increased risk of stillbirth and preterm birth. ° °What is my risk of getting COVID-19?  °Your risk of getting COVID-19 depends on the chance that you will come into contact with another infected person. The risk may be higher if you live in a community where there is a lot of COVID-19 infection or work in healthcare or another high-contact setting.  ° °What is my risk for severe complications if I get COVID-19?  °Data show that older pregnant women; those with preexisting health conditions, such as a body mass index higher than 35 kg/m2, diabetes, and heart disorders; and Black or Latinx women have an especially increased risk of severe disease and death from COVID-19. °  °If you still have questions about the vaccines or need more information, ask your health care provider or go to the Centers for Disease Control and Prevention’s COVID-19 vaccine webpage.  ° °An Update on the Johnson & Johnson Vaccine  ° °In April 2021, the FDA and CDC called for a brief pause to use of the Johnson & Johnson vaccine. They did so after reports of a severe side effect in a very small number of women younger than age 50 following vaccination. This side effect, called thrombosis with thrombocytopenia syndrome (TTS), causes blood clots (thrombosis) combined with low levels of platelets (thrombocytopenia). ° °TTS following the Johnson & Johnson vaccine is extremely rare. At the time of this update, it has occurred in only 7 people per 1 million Johnson & Johnson shots given. According to the CDC, being on hormonal birth control (the pill, patch, or ring), pregnancy, breastfeeding, or being recently pregnant does not make you more likely to develop TTS after getting the Johnson & Johnson vaccine. The pause was lifted on June 26, 2019, after the FDA and CDC determined that the known benefits of the Johnson & Johnson vaccine far outweigh the risks.  ° °Health care professionals  have been alerted to the possibility of this side effect in people who have received the Johnson & Johnson vaccine. National organizations continue to recommend COVID-19 vaccination with any of the vaccines for pregnant women. All women younger than age 50 years, whether pregnant, breastfeeding, or not, should be aware of the very rare risk of TTS after getting the Johnson & Johnson vaccine. The Pfizer and Moderna vaccines don’t have this risk. If you get the Johnson & Johnson vaccine,  °seek medical help right away if you develop any of the following symptoms within 3 weeks of getting your shot: ° °• Severe or persistent headaches or blurred vision °• Shortness of breath °• Chest pain °• Leg swelling °• Persistent abdominal pain °• Easy bruising or tiny blood spots under the skin beyond the injection site ° °Experts continue to collect health and safety information from pregnant people who have been vaccinated. If you have questions about vaccination during pregnancy, visit the CDC website or talk to your health care professional. Information for Breastfeeding/Lactating Individuals The Society for Maternal-Fetal Medicine and other pregnancy experts recommend COVID-19 vaccination for people who are breastfeeding/lactating. You don’t have to delay or stop  °breastfeeding just because you get vaccinated.  ° °Getting Vaccinated  °You can get vaccinated at   any time during pregnancy. The CDC is committed to monitoring the vaccine’s safety for all individuals. Your health professional or vaccine clinic may give you information about enrolling in the v-safe after vaccination health checker (see the box below).Even after you’re fully vaccinated, it is important to follow the CDC’s guidance for wearing a mask indoors in areas where there are substantial or high rates of COVID-19 infection.  ° °What Happens When You Enroll in v-Safe?  °The v-safe after vaccination health checker program lets the CDC check in with you after  your vaccination. At sign-up, you can indicate that you are pregnant. Once you do that, expect the following: °• Someone may call you from the v-safe program to ask initial questions and get more information. °• You may be asked to enroll in the vaccine pregnancy registry, which is collecting information about any effects of the vaccine during pregnancy. This is a great way to help scientists monitor the vaccine’s safety and effectiveness.  ° °References °1. Oliver SE, Gargano JW, Marin M, Wallace M, Curran KG, Chamberland M, et al. The Advisory Committee on Immunization Practices’ Interim Recommendation for Use of Pfizer-BioNTech  °COVID-19 Vaccine -- [place], December 2020. MMWR Morbidity and Mortality Weekly Report 2020;69. °2. FDA Briefing Document. Janssen Ad26.COV2.S Vaccine for the Prevention of COVID-19. 2021. Accessed May 08, 2019; Available from: https://www.fda.gov/media/146217/download °3. PFIZER-BIONTECH COVID-19 VACCINE [package insert] [place]: Pfizer and [place], [place]: Biontech;2020. °4. FDA Briefing Document. Moderna COVID-19 Vaccine. 2020. Accessed 2020, Dec 18; Available from: https://www.fda.gov/media/144434/download  °5. Gray KJ, Bordt EA, Atyeo C, Deriso E, Akinwunmi B, Young N, et al. COVID-19 vaccine response in pregnant and lactating women: a cohort study. Am J Obstet Gynecol 2021 Mar 24. °6. Panagiotakopoulos L, Myers TR, Gee J, Lipkind HS, Kharbanda EO, Ryan DS, et al. SARS-CoV-2 Infection Among Hospitalized Pregnant Women: Reasons for Admission and Pregnancy  °Characteristics - [facility], March 1-Aug 02, 2018. MMWR Morb Mortal Wkly Rep 2020 Sep 23;69(38):1355-9. °7. Zambrano LD, Ellington S, Strid P, Galang RR, Oduyebo T, Tong VT, et al. Update: Characteristics of Symptomatic Women of Reproductive Age with Laboratory-Confirmed SARSCoV-2 Infection by Pregnancy Status - [place], January 22-December 06, 2018. MMWR Morb Mortal Wkly Rep 2020 Nov  6;69(44):1641-7. °8. Delahoy MJ, Whitaker M, O'Halloran A, Chai SJ, Kirley PD, Alden N, et al. Characteristics and Maternal and Birth Outcomes of Hospitalized Pregnant Women with Laboratory-Confirmed COVID-19 - COVID-NET, 13 States, March 1-October 25, 2018. MMWR Morb Mortal Wkly Rep 2020 Sep 25;69(38):1347-54. ° °

## 2020-04-28 ENCOUNTER — Other Ambulatory Visit: Payer: Self-pay

## 2020-04-28 ENCOUNTER — Other Ambulatory Visit (HOSPITAL_COMMUNITY)
Admission: RE | Admit: 2020-04-28 | Discharge: 2020-04-28 | Disposition: A | Discharge status: Home / Self Care | Payer: Medicaid Other | Source: Ambulatory Visit | Attending: Student | Admitting: Student

## 2020-04-28 ENCOUNTER — Ambulatory Visit (INDEPENDENT_AMBULATORY_CARE_PROVIDER_SITE_OTHER): Payer: Medicaid Other | Admitting: Student

## 2020-04-28 VITALS — BP 104/61 | HR 93 | Wt 199.0 lb

## 2020-04-28 DIAGNOSIS — O0990 Supervision of high risk pregnancy, unspecified, unspecified trimester: Secondary | ICD-10-CM

## 2020-04-28 DIAGNOSIS — Z3A36 36 weeks gestation of pregnancy: Secondary | ICD-10-CM

## 2020-04-28 NOTE — Progress Notes (Signed)
   PRENATAL VISIT NOTE  Subjective:  Linda Holmes is a [name] at [redacted]w[redacted]d being seen today for ongoing prenatal care.  She is currently monitored for the following issues for this low-risk pregnancy and has Thalassemia trait; History of cesarean delivery; Supervision of high risk pregnancy, antepartum; Short interval between pregnancies affecting pregnancy, antepartum; Obesity in pregnancy; and COVID-19 on their problem list.  Patient reports no complaints.  Contractions: Irritability. Vag. Bleeding: None.  Movement: Present. Denies leaking of fluid.   The following portions of the patient's history were reviewed and updated as appropriate: allergies, current medications, past family history, past medical history, past social history, past surgical history and problem list.   Objective:   Vitals:   04/28/20 0906  BP: 104/61  Pulse: 93  Weight: 199 lb (90.3 kg)    Fetal Status: Fetal Heart Rate (bpm): 140 Fundal Height: 39 cm Movement: Present  Presentation: Vertex  General:  Alert, oriented and cooperative. Patient is in no acute distress.  Skin: Skin is warm and dry. No rash noted.   Cardiovascular: Normal heart rate noted  Respiratory: Normal respiratory effort, no problems with respiration noted  Abdomen: Soft, gravid, appropriate for gestational age.  Pain/Pressure: Present     Pelvic: Cervical exam deferred Dilation: Closed Effacement (%): Thick    Extremities: Normal range of motion.  Edema: None  Mental Status: Normal mood and affect. Normal behavior. Normal judgment and thought content.   Assessment and Plan:  Pregnancy: G2P1001 at [redacted]w[redacted]d 1. Supervision of high risk pregnancy, antepartum -Patient declined partner testing for thalassemias  -vertex by leopolds -does not want to get on birth control -reviewed labor course with son, patient stopped at 7 cm and had C/section for NRFHT, patient wants to do TOLAC; consent verified and in chart complete - Culture, beta  strep (group b only) - GC/Chlamydia probe amp ([place])not at [facility]  Preterm labor symptoms and general obstetric precautions including but not limited to vaginal bleeding, contractions, leaking of fluid and fetal movement were reviewed in detail with the patient. Please refer to After Visit Summary for other counseling recommendations.   Return in about 2 weeks (around 05/12/2020), or LROB on My chart with KK.  No future appointments.  Marylene Land, CNM

## 2020-04-29 LAB — GC/CHLAMYDIA PROBE AMP (~~LOC~~) NOT AT ARMC
Chlamydia: NEGATIVE
Comment: NEGATIVE
Comment: NORMAL
Neisseria Gonorrhea: NEGATIVE

## 2020-05-02 LAB — CULTURE, BETA STREP (GROUP B ONLY): Strep Gp B Culture: NEGATIVE

## 2020-05-16 ENCOUNTER — Other Ambulatory Visit: Payer: Self-pay

## 2020-05-16 ENCOUNTER — Encounter: Payer: Self-pay | Admitting: *Deleted

## 2020-05-16 ENCOUNTER — Encounter: Payer: Self-pay | Admitting: Nurse Practitioner

## 2020-05-16 ENCOUNTER — Telehealth (INDEPENDENT_AMBULATORY_CARE_PROVIDER_SITE_OTHER): Payer: Medicaid Other | Admitting: Nurse Practitioner

## 2020-05-16 DIAGNOSIS — O99210 Obesity complicating pregnancy, unspecified trimester: Secondary | ICD-10-CM

## 2020-05-16 DIAGNOSIS — O0993 Supervision of high risk pregnancy, unspecified, third trimester: Secondary | ICD-10-CM

## 2020-05-16 DIAGNOSIS — O0990 Supervision of high risk pregnancy, unspecified, unspecified trimester: Secondary | ICD-10-CM

## 2020-05-16 DIAGNOSIS — Z8616 Personal history of COVID-19: Secondary | ICD-10-CM

## 2020-05-16 DIAGNOSIS — Z148 Genetic carrier of other disease: Secondary | ICD-10-CM

## 2020-05-16 DIAGNOSIS — Z3A39 39 weeks gestation of pregnancy: Secondary | ICD-10-CM

## 2020-05-16 DIAGNOSIS — O99213 Obesity complicating pregnancy, third trimester: Secondary | ICD-10-CM

## 2020-05-16 NOTE — Progress Notes (Signed)
Addendum: Scheduled IOL for 05/30/20. L & D will call patient when ready. MyChart message sent to patient. Haruo Stepanek,RN

## 2020-05-16 NOTE — Progress Notes (Signed)
I connected with  Linda Holmes on 05/16/20 at 11:05 byMychart video visit  and verified that I am speaking with the correct person using two identifiers.   I discussed the limitations, risks, security and privacy concerns of performing an evaluation and management service by telephone and the availability of in person appointments. I also discussed with the patient that there may be a patient responsible charge related to this service. The patient expressed understanding and agreed to proceed.   States needs letter for leave to start today instead of 05/23/20. States not at home and doesn't have cuff with her to check bp.  Keiva Dina,RN Tyquon Near,RN 05/16/2020  11:12 AM

## 2020-05-16 NOTE — Progress Notes (Signed)
   OBSTETRICS PRENATAL VIRTUAL VISIT ENCOUNTER NOTE  Provider location: [facility]   I connected with Linda Holmes on 05/16/20 at 10:55 AM EDT by MyChart Video Encounter at home and verified that I am speaking with the correct person using two identifiers.   I discussed the limitations, risks, security and privacy concerns of performing an evaluation and management service virtually and the availability of in person appointments. I also discussed with the patient that there may be a patient responsible charge related to this service. The patient expressed understanding and agreed to proceed. Subjective:  Linda Holmes is a [name] at [redacted]w[redacted]d being seen today for ongoing prenatal care.  She is currently monitored for the following issues for this low-risk pregnancy and has Thalassemia trait; History of cesarean delivery; Supervision of high risk pregnancy, antepartum; Short interval between pregnancies affecting pregnancy, antepartum; Obesity in pregnancy; and COVID-19 on their problem list. Today patient is connecting from the hair salon where she is getting her hair done.  Patient reports no complaints.  Contractions: Irregular. Vag. Bleeding: None.  Movement: Present. Denies any leaking of fluid.   The following portions of the patient's history were reviewed and updated as appropriate: allergies, current medications, past family history, past medical history, past social history, past surgical history and problem list.   Objective:  There were no vitals filed for this visit.  Fetal Status:     Movement: Present     General:  Alert, oriented and cooperative. Patient is in no acute distress.  Respiratory: Normal respiratory effort, no problems with respiration noted  Mental Status: Normal mood and affect. Normal behavior. Normal judgment and thought content.  Rest of physical exam deferred due to type of encounter  Imaging: No results  found.  Assessment and Plan:  Pregnancy: G2P1001 at [redacted]w[redacted]d 1. Supervision of high risk pregnancy, antepartum Feeling the baby move well. Office will contact her to schedule her next appointment in the office and at that time we can review what to expect on the date of her induction. No BP today at this visit. No BPs listed in Babyscripts.  2. Obesity in pregnancy   3. [redacted] weeks gestation of pregnancy Will schedule for induction at 41 weeks  Term labor symptoms and general obstetric precautions including but not limited to vaginal bleeding, contractions, leaking of fluid and fetal movement were reviewed in detail with the patient. I discussed the assessment and treatment plan with the patient. The patient was provided an opportunity to ask questions and all were answered. The patient agreed with the plan and demonstrated an understanding of the instructions. The patient was advised to call back or seek an in-person office evaluation/go to [facility] at [facility] for any urgent or concerning symptoms. Please refer to After Visit Summary for other counseling recommendations.   I provided 5 minutes of face-to-face time during this encounter.  Return in about 8 days (around 05/24/2020) for in person visit at [redacted]w[redacted]d.  No future appointments. at the time of this note.  Requested appt at [redacted]w[redacted]d for BPP with Diane at the appointment.  Currie Paris, NP Center for [employer], [facility]

## 2020-05-17 ENCOUNTER — Encounter (HOSPITAL_COMMUNITY): Payer: Self-pay | Admitting: *Deleted

## 2020-05-17 ENCOUNTER — Telehealth (HOSPITAL_COMMUNITY): Payer: Self-pay | Admitting: *Deleted

## 2020-05-17 NOTE — Telephone Encounter (Signed)
Preadmission screen  

## 2020-05-24 ENCOUNTER — Encounter (HOSPITAL_COMMUNITY): Payer: Self-pay | Admitting: Obstetrics and Gynecology

## 2020-05-24 ENCOUNTER — Encounter (HOSPITAL_COMMUNITY)
Admission: AD | Disposition: A | Discharge status: Home / Self Care | Payer: Self-pay | Source: Home / Self Care | Attending: Family Medicine

## 2020-05-24 ENCOUNTER — Inpatient Hospital Stay (HOSPITAL_COMMUNITY)
Admission: AD | Admit: 2020-05-24 | Discharge: 2020-05-26 | DRG: 788 | Disposition: A | Discharge status: Home / Self Care | Payer: Medicaid Other | Attending: Family Medicine | Admitting: Family Medicine

## 2020-05-24 ENCOUNTER — Other Ambulatory Visit: Payer: Self-pay

## 2020-05-24 ENCOUNTER — Inpatient Hospital Stay (HOSPITAL_COMMUNITY): Payer: Medicaid Other | Admitting: Anesthesiology

## 2020-05-24 DIAGNOSIS — Z8616 Personal history of COVID-19: Secondary | ICD-10-CM | POA: Diagnosis not present

## 2020-05-24 DIAGNOSIS — O09899 Supervision of other high risk pregnancies, unspecified trimester: Secondary | ICD-10-CM

## 2020-05-24 DIAGNOSIS — O99214 Obesity complicating childbirth: Secondary | ICD-10-CM | POA: Diagnosis present

## 2020-05-24 DIAGNOSIS — Z98891 History of uterine scar from previous surgery: Secondary | ICD-10-CM

## 2020-05-24 DIAGNOSIS — O34211 Maternal care for low transverse scar from previous cesarean delivery: Secondary | ICD-10-CM | POA: Diagnosis present

## 2020-05-24 DIAGNOSIS — O26893 Other specified pregnancy related conditions, third trimester: Secondary | ICD-10-CM | POA: Diagnosis not present

## 2020-05-24 DIAGNOSIS — O480 Post-term pregnancy: Secondary | ICD-10-CM | POA: Diagnosis not present

## 2020-05-24 DIAGNOSIS — U071 COVID-19: Secondary | ICD-10-CM | POA: Diagnosis present

## 2020-05-24 DIAGNOSIS — D563 Thalassemia minor: Secondary | ICD-10-CM | POA: Diagnosis present

## 2020-05-24 DIAGNOSIS — O99210 Obesity complicating pregnancy, unspecified trimester: Secondary | ICD-10-CM | POA: Diagnosis present

## 2020-05-24 DIAGNOSIS — Z3A40 40 weeks gestation of pregnancy: Secondary | ICD-10-CM

## 2020-05-24 DIAGNOSIS — O0990 Supervision of high risk pregnancy, unspecified, unspecified trimester: Secondary | ICD-10-CM

## 2020-05-24 DIAGNOSIS — O9081 Anemia of the puerperium: Secondary | ICD-10-CM | POA: Diagnosis not present

## 2020-05-24 DIAGNOSIS — O9902 Anemia complicating childbirth: Secondary | ICD-10-CM | POA: Diagnosis not present

## 2020-05-24 DIAGNOSIS — Z3A41 41 weeks gestation of pregnancy: Secondary | ICD-10-CM | POA: Diagnosis present

## 2020-05-24 DIAGNOSIS — D569 Thalassemia, unspecified: Secondary | ICD-10-CM | POA: Diagnosis not present

## 2020-05-24 LAB — CBC
HCT: 31.2 % — ABNORMAL LOW (ref 36.0–46.0)
Hemoglobin: 9.2 g/dL — ABNORMAL LOW (ref 12.0–15.0)
MCH: 20.5 pg — ABNORMAL LOW (ref 26.0–34.0)
MCHC: 29.5 g/dL — ABNORMAL LOW (ref 30.0–36.0)
MCV: 69.5 fL — ABNORMAL LOW (ref 80.0–100.0)
Platelets: 304 10*3/uL (ref 150–400)
RBC: 4.49 MIL/uL (ref 3.87–5.11)
RDW: 17.6 % — ABNORMAL HIGH (ref 11.5–15.5)
WBC: 10.6 10*3/uL — ABNORMAL HIGH (ref 4.0–10.5)
nRBC: 0 % (ref 0.0–0.2)

## 2020-05-24 LAB — TYPE AND SCREEN
ABO/RH(D): A POS
Antibody Screen: NEGATIVE

## 2020-05-24 LAB — RPR: RPR Ser Ql: NONREACTIVE

## 2020-05-24 SURGERY — Surgical Case
Anesthesia: Epidural

## 2020-05-24 MED ORDER — LIDOCAINE HCL (PF) 1 % IJ SOLN: 30.0000 mL | INTRAMUSCULAR | Status: DC | PRN

## 2020-05-24 MED ORDER — SODIUM CHLORIDE 0.9 % IV SOLN
500.0000 mg | Freq: Once | INTRAVENOUS | Status: AC
  Administered 2020-05-24: 500 mg via INTRAVENOUS

## 2020-05-24 MED ORDER — ENOXAPARIN SODIUM 40 MG/0.4ML ~~LOC~~ SOLN
40.0000 mg | SUBCUTANEOUS | Status: DC
  Administered 2020-05-25: 40 mg via SUBCUTANEOUS
  Filled 2020-05-24: qty 0.4

## 2020-05-24 MED ORDER — MORPHINE SULFATE (PF) 0.5 MG/ML IJ SOLN
INTRAMUSCULAR | Status: DC | PRN
  Administered 2020-05-24: 3 mg via EPIDURAL

## 2020-05-24 MED ORDER — ONDANSETRON HCL 4 MG/2ML IJ SOLN
INTRAMUSCULAR | Status: DC | PRN
  Administered 2020-05-24: 4 mg via INTRAVENOUS

## 2020-05-24 MED ORDER — FENTANYL CITRATE (PF) 100 MCG/2ML IJ SOLN
50.0000 ug | INTRAMUSCULAR | Status: DC | PRN
  Administered 2020-05-24: 50 ug via INTRAVENOUS
  Filled 2020-05-24: qty 2

## 2020-05-24 MED ORDER — OXYTOCIN BOLUS FROM INFUSION: 333.0000 mL | Freq: Once | INTRAVENOUS | Status: DC

## 2020-05-24 MED ORDER — HYDROMORPHONE HCL 1 MG/ML IJ SOLN: 0.2500 mg | INTRAMUSCULAR | Status: DC | PRN

## 2020-05-24 MED ORDER — OXYCODONE HCL 5 MG PO TABS: 5.0000 mg | ORAL_TABLET | Freq: Once | ORAL | Status: DC | PRN

## 2020-05-24 MED ORDER — ZOLPIDEM TARTRATE 5 MG PO TABS: 5.0000 mg | ORAL_TABLET | Freq: Every evening | ORAL | Status: DC | PRN

## 2020-05-24 MED ORDER — KETOROLAC TROMETHAMINE 30 MG/ML IJ SOLN
30.0000 mg | Freq: Four times a day (QID) | INTRAMUSCULAR | Status: AC | PRN
  Administered 2020-05-24 – 2020-05-25 (×2): 30 mg via INTRAVENOUS

## 2020-05-24 MED ORDER — SIMETHICONE 80 MG PO CHEW
80.0000 mg | CHEWABLE_TABLET | Freq: Three times a day (TID) | ORAL | Status: DC
  Administered 2020-05-25 – 2020-05-26 (×4): 80 mg via ORAL
  Filled 2020-05-24 (×4): qty 1

## 2020-05-24 MED ORDER — SOD CITRATE-CITRIC ACID 500-334 MG/5ML PO SOLN
30.0000 mL | ORAL | Status: DC | PRN
Start: 2020-05-24 — End: 2020-05-24
  Administered 2020-05-24: 30 mL via ORAL
  Filled 2020-05-24: qty 15

## 2020-05-24 MED ORDER — METOCLOPRAMIDE HCL 5 MG/ML IJ SOLN
INTRAMUSCULAR | Status: DC | PRN
  Administered 2020-05-24: 10 mg via INTRAVENOUS

## 2020-05-24 MED ORDER — LIDOCAINE HCL (PF) 1 % IJ SOLN
INTRAMUSCULAR | Status: DC | PRN
  Administered 2020-05-24: 10 mL via EPIDURAL
  Administered 2020-05-24: 2 mL via EPIDURAL

## 2020-05-24 MED ORDER — MEPERIDINE HCL 25 MG/ML IJ SOLN: 6.2500 mg | INTRAMUSCULAR | Status: DC | PRN

## 2020-05-24 MED ORDER — LACTATED RINGERS IV SOLN: INTRAVENOUS | Status: DC | PRN

## 2020-05-24 MED ORDER — PHENYLEPHRINE HCL (PRESSORS) 10 MG/ML IV SOLN
INTRAVENOUS | Status: DC | PRN
  Administered 2020-05-24 (×2): 120 ug via INTRAVENOUS
  Administered 2020-05-24: 40 ug via INTRAVENOUS
  Administered 2020-05-24: 120 ug via INTRAVENOUS

## 2020-05-24 MED ORDER — ACETAMINOPHEN 325 MG PO TABS: 650.0000 mg | ORAL_TABLET | ORAL | Status: DC | PRN

## 2020-05-24 MED ORDER — MEASLES, MUMPS & RUBELLA VAC IJ SOLR: 0.5000 mL | Freq: Once | INTRAMUSCULAR | Status: DC

## 2020-05-24 MED ORDER — MEPERIDINE HCL 25 MG/ML IJ SOLN
INTRAMUSCULAR | Status: AC
  Filled 2020-05-24: qty 1

## 2020-05-24 MED ORDER — DEXAMETHASONE SODIUM PHOSPHATE 4 MG/ML IJ SOLN
INTRAMUSCULAR | Status: DC | PRN
  Administered 2020-05-24: 10 mg via INTRAVENOUS

## 2020-05-24 MED ORDER — SODIUM CHLORIDE 0.9% FLUSH: 3.0000 mL | INTRAVENOUS | Status: DC | PRN

## 2020-05-24 MED ORDER — OXYCODONE-ACETAMINOPHEN 5-325 MG PO TABS: 1.0000 | ORAL_TABLET | ORAL | Status: DC | PRN

## 2020-05-24 MED ORDER — TERBUTALINE SULFATE 1 MG/ML IJ SOLN
0.2500 mg | Freq: Once | INTRAMUSCULAR | Status: AC | PRN
  Administered 2020-05-24: 0.25 mg via SUBCUTANEOUS
  Filled 2020-05-24: qty 1

## 2020-05-24 MED ORDER — OXYCODONE-ACETAMINOPHEN 5-325 MG PO TABS: 2.0000 | ORAL_TABLET | ORAL | Status: DC | PRN

## 2020-05-24 MED ORDER — SENNOSIDES-DOCUSATE SODIUM 8.6-50 MG PO TABS
2.0000 | ORAL_TABLET | ORAL | Status: DC
  Administered 2020-05-25: 2 via ORAL
  Filled 2020-05-24: qty 2

## 2020-05-24 MED ORDER — LACTATED RINGERS IV SOLN
500.0000 mL | INTRAVENOUS | Status: DC | PRN
  Administered 2020-05-24: 500 mL via INTRAVENOUS

## 2020-05-24 MED ORDER — IBUPROFEN 800 MG PO TABS
800.0000 mg | ORAL_TABLET | Freq: Four times a day (QID) | ORAL | Status: DC
  Administered 2020-05-25 – 2020-05-26 (×3): 800 mg via ORAL
  Filled 2020-05-24 (×3): qty 1

## 2020-05-24 MED ORDER — NALBUPHINE HCL 10 MG/ML IJ SOLN: 5.0000 mg | Freq: Once | INTRAMUSCULAR | Status: DC | PRN

## 2020-05-24 MED ORDER — TRANEXAMIC ACID-NACL 1000-0.7 MG/100ML-% IV SOLN
1000.0000 mg | INTRAVENOUS | Status: AC
  Administered 2020-05-24: 1000 mg via INTRAVENOUS

## 2020-05-24 MED ORDER — OXYTOCIN-SODIUM CHLORIDE 30-0.9 UT/500ML-% IV SOLN
INTRAVENOUS | Status: AC
  Filled 2020-05-24: qty 500

## 2020-05-24 MED ORDER — PHENYLEPHRINE 40 MCG/ML (10ML) SYRINGE FOR IV PUSH (FOR BLOOD PRESSURE SUPPORT): 80.0000 ug | PREFILLED_SYRINGE | INTRAVENOUS | Status: DC | PRN

## 2020-05-24 MED ORDER — OXYCODONE HCL 5 MG PO TABS: 5.0000 mg | ORAL_TABLET | ORAL | Status: DC | PRN

## 2020-05-24 MED ORDER — OXYTOCIN-SODIUM CHLORIDE 30-0.9 UT/500ML-% IV SOLN
INTRAVENOUS | Status: DC | PRN
  Administered 2020-05-24: 400 mL via INTRAVENOUS

## 2020-05-24 MED ORDER — ONDANSETRON HCL 4 MG/2ML IJ SOLN
INTRAMUSCULAR | Status: AC
  Filled 2020-05-24: qty 2

## 2020-05-24 MED ORDER — METOCLOPRAMIDE HCL 5 MG/ML IJ SOLN
INTRAMUSCULAR | Status: AC
  Filled 2020-05-24: qty 2

## 2020-05-24 MED ORDER — DIPHENHYDRAMINE HCL 50 MG/ML IJ SOLN: 12.5000 mg | INTRAMUSCULAR | Status: DC | PRN

## 2020-05-24 MED ORDER — KETOROLAC TROMETHAMINE 30 MG/ML IJ SOLN: 30.0000 mg | Freq: Four times a day (QID) | INTRAMUSCULAR | Status: AC | PRN

## 2020-05-24 MED ORDER — NALBUPHINE HCL 10 MG/ML IJ SOLN
5.0000 mg | Freq: Once | INTRAMUSCULAR | Status: DC | PRN
Start: 2020-05-24 — End: 2020-05-26

## 2020-05-24 MED ORDER — FLEET ENEMA 7-19 GM/118ML RE ENEM: 1.0000 | ENEMA | RECTAL | Status: DC | PRN

## 2020-05-24 MED ORDER — CEFAZOLIN SODIUM-DEXTROSE 2-4 GM/100ML-% IV SOLN
INTRAVENOUS | Status: AC
  Filled 2020-05-24: qty 100

## 2020-05-24 MED ORDER — PHENYLEPHRINE 40 MCG/ML (10ML) SYRINGE FOR IV PUSH (FOR BLOOD PRESSURE SUPPORT)
PREFILLED_SYRINGE | INTRAVENOUS | Status: AC
  Filled 2020-05-24: qty 10

## 2020-05-24 MED ORDER — LACTATED RINGERS IV SOLN: INTRAVENOUS | Status: DC

## 2020-05-24 MED ORDER — KETOROLAC TROMETHAMINE 30 MG/ML IJ SOLN
INTRAMUSCULAR | Status: AC
  Filled 2020-05-24: qty 1

## 2020-05-24 MED ORDER — CEFAZOLIN SODIUM-DEXTROSE 2-4 GM/100ML-% IV SOLN
2.0000 g | Freq: Once | INTRAVENOUS | Status: AC
  Administered 2020-05-24: 2 g via INTRAVENOUS

## 2020-05-24 MED ORDER — ONDANSETRON HCL 4 MG/2ML IJ SOLN
4.0000 mg | Freq: Four times a day (QID) | INTRAMUSCULAR | Status: DC | PRN
  Administered 2020-05-24: 4 mg via INTRAVENOUS
  Filled 2020-05-24: qty 2

## 2020-05-24 MED ORDER — SCOPOLAMINE 1 MG/3DAYS TD PT72: 1.0000 | MEDICATED_PATCH | Freq: Once | TRANSDERMAL | Status: DC

## 2020-05-24 MED ORDER — EPHEDRINE 5 MG/ML INJ: 10.0000 mg | INTRAVENOUS | Status: DC | PRN

## 2020-05-24 MED ORDER — MORPHINE SULFATE (PF) 0.5 MG/ML IJ SOLN
INTRAMUSCULAR | Status: AC
  Filled 2020-05-24: qty 10

## 2020-05-24 MED ORDER — NALBUPHINE HCL 10 MG/ML IJ SOLN: 5.0000 mg | INTRAMUSCULAR | Status: DC | PRN

## 2020-05-24 MED ORDER — FENTANYL-BUPIVACAINE-NACL 0.5-0.125-0.9 MG/250ML-% EP SOLN
EPIDURAL | Status: DC | PRN
  Administered 2020-05-24: 12 mL/h via EPIDURAL

## 2020-05-24 MED ORDER — MENTHOL 3 MG MT LOZG: 1.0000 | LOZENGE | OROMUCOSAL | Status: DC | PRN

## 2020-05-24 MED ORDER — OXYTOCIN-SODIUM CHLORIDE 30-0.9 UT/500ML-% IV SOLN: 2.5000 [IU]/h | INTRAVENOUS | Status: DC

## 2020-05-24 MED ORDER — DIPHENHYDRAMINE HCL 25 MG PO CAPS: 25.0000 mg | ORAL_CAPSULE | Freq: Four times a day (QID) | ORAL | Status: DC | PRN

## 2020-05-24 MED ORDER — NALOXONE HCL 4 MG/10ML IJ SOLN
1.0000 ug/kg/h | INTRAMUSCULAR | Status: DC | PRN
  Filled 2020-05-24: qty 5

## 2020-05-24 MED ORDER — OXYTOCIN-SODIUM CHLORIDE 30-0.9 UT/500ML-% IV SOLN: 2.5000 [IU]/h | INTRAVENOUS | Status: AC

## 2020-05-24 MED ORDER — STERILE WATER FOR IRRIGATION IR SOLN
Status: DC | PRN
  Administered 2020-05-24: 1000 mL

## 2020-05-24 MED ORDER — ACETAMINOPHEN 500 MG PO TABS
1000.0000 mg | ORAL_TABLET | Freq: Four times a day (QID) | ORAL | Status: DC
  Administered 2020-05-24 – 2020-05-26 (×5): 1000 mg via ORAL
  Filled 2020-05-24 (×6): qty 2

## 2020-05-24 MED ORDER — KETOROLAC TROMETHAMINE 30 MG/ML IJ SOLN
30.0000 mg | Freq: Four times a day (QID) | INTRAMUSCULAR | Status: AC
  Filled 2020-05-24 (×3): qty 1

## 2020-05-24 MED ORDER — PROMETHAZINE HCL 25 MG/ML IJ SOLN: 6.2500 mg | INTRAMUSCULAR | Status: DC | PRN

## 2020-05-24 MED ORDER — ONDANSETRON HCL 4 MG/2ML IJ SOLN: 4.0000 mg | Freq: Three times a day (TID) | INTRAMUSCULAR | Status: DC | PRN

## 2020-05-24 MED ORDER — COCONUT OIL OIL: 1.0000 "application " | TOPICAL_OIL | Status: DC | PRN

## 2020-05-24 MED ORDER — FENTANYL CITRATE (PF) 100 MCG/2ML IJ SOLN
100.0000 ug | INTRAMUSCULAR | Status: DC | PRN
  Administered 2020-05-24: 100 ug via INTRAVENOUS
  Filled 2020-05-24: qty 2

## 2020-05-24 MED ORDER — DIBUCAINE (PERIANAL) 1 % EX OINT: 1.0000 "application " | TOPICAL_OINTMENT | CUTANEOUS | Status: DC | PRN

## 2020-05-24 MED ORDER — PRENATAL MULTIVITAMIN CH
1.0000 | ORAL_TABLET | Freq: Every day | ORAL | Status: DC
  Administered 2020-05-25: 1 via ORAL
  Filled 2020-05-24: qty 1

## 2020-05-24 MED ORDER — KETOROLAC TROMETHAMINE 30 MG/ML IJ SOLN
30.0000 mg | Freq: Once | INTRAMUSCULAR | Status: AC | PRN
  Administered 2020-05-24: 30 mg via INTRAVENOUS

## 2020-05-24 MED ORDER — MEPERIDINE HCL 25 MG/ML IJ SOLN
INTRAMUSCULAR | Status: DC | PRN
  Administered 2020-05-24 (×2): 12.5 mg via INTRAVENOUS

## 2020-05-24 MED ORDER — LIDOCAINE-EPINEPHRINE (PF) 2 %-1:200000 IJ SOLN
INTRAMUSCULAR | Status: DC | PRN
  Administered 2020-05-24: 10 mL via EPIDURAL

## 2020-05-24 MED ORDER — ACETAMINOPHEN 500 MG PO TABS: 1000.0000 mg | ORAL_TABLET | Freq: Four times a day (QID) | ORAL | Status: DC

## 2020-05-24 MED ORDER — TETANUS-DIPHTH-ACELL PERTUSSIS 5-2.5-18.5 LF-MCG/0.5 IM SUSY: 0.5000 mL | PREFILLED_SYRINGE | Freq: Once | INTRAMUSCULAR | Status: DC

## 2020-05-24 MED ORDER — PHENYLEPHRINE HCL-NACL 10-0.9 MG/250ML-% IV SOLN
INTRAVENOUS | Status: DC | PRN
  Administered 2020-05-24: 60 ug/min via INTRAVENOUS

## 2020-05-24 MED ORDER — NALOXONE HCL 0.4 MG/ML IJ SOLN: 0.4000 mg | INTRAMUSCULAR | Status: DC | PRN

## 2020-05-24 MED ORDER — SODIUM CHLORIDE 0.9 % IR SOLN
Status: DC | PRN
  Administered 2020-05-24: 1

## 2020-05-24 MED ORDER — OXYCODONE HCL 5 MG/5ML PO SOLN: 5.0000 mg | Freq: Once | ORAL | Status: DC | PRN

## 2020-05-24 MED ORDER — MISOPROSTOL 25 MCG QUARTER TABLET: 25.0000 ug | ORAL_TABLET | ORAL | Status: DC | PRN

## 2020-05-24 MED ORDER — FENTANYL-BUPIVACAINE-NACL 0.5-0.125-0.9 MG/250ML-% EP SOLN
12.0000 mL/h | EPIDURAL | Status: DC | PRN
Start: 2020-05-24 — End: 2020-05-24
  Filled 2020-05-24: qty 250

## 2020-05-24 MED ORDER — DIPHENHYDRAMINE HCL 25 MG PO CAPS: 25.0000 mg | ORAL_CAPSULE | ORAL | Status: DC | PRN

## 2020-05-24 MED ORDER — OXYTOCIN-SODIUM CHLORIDE 30-0.9 UT/500ML-% IV SOLN: 1.0000 m[IU]/min | INTRAVENOUS | Status: DC

## 2020-05-24 MED ORDER — SIMETHICONE 80 MG PO CHEW: 80.0000 mg | CHEWABLE_TABLET | ORAL | Status: DC | PRN

## 2020-05-24 MED ORDER — LACTATED RINGERS IV SOLN
500.0000 mL | Freq: Once | INTRAVENOUS | Status: AC
  Administered 2020-05-24: 500 mL via INTRAVENOUS

## 2020-05-24 MED ORDER — DEXAMETHASONE SODIUM PHOSPHATE 10 MG/ML IJ SOLN
INTRAMUSCULAR | Status: AC
  Filled 2020-05-24: qty 1

## 2020-05-24 MED ORDER — WITCH HAZEL-GLYCERIN EX PADS: 1.0000 "application " | MEDICATED_PAD | CUTANEOUS | Status: DC | PRN

## 2020-05-24 SURGICAL SUPPLY — 33 items
BENZOIN TINCTURE PRP APPL 2/3 (GAUZE/BANDAGES/DRESSINGS) ×2 IMPLANT
CHLORAPREP W/TINT 26ML (MISCELLANEOUS) ×2 IMPLANT
CLAMP CORD UMBIL (MISCELLANEOUS) IMPLANT
CLOTH BEACON ORANGE TIMEOUT ST (SAFETY) ×2 IMPLANT
DRSG OPSITE POSTOP 4X10 (GAUZE/BANDAGES/DRESSINGS) ×2 IMPLANT
ELECT REM PT RETURN 9FT ADLT (ELECTROSURGICAL) ×2
ELECTRODE REM PT RTRN 9FT ADLT (ELECTROSURGICAL) ×1 IMPLANT
EXTRACTOR VACUUM M CUP 4 TUBE (SUCTIONS) IMPLANT
GLOVE BIOGEL PI IND STRL 7.0 (GLOVE) ×2 IMPLANT
GLOVE BIOGEL PI IND STRL 7.5 (GLOVE) ×2 IMPLANT
GLOVE BIOGEL PI INDICATOR 7.0 (GLOVE) ×2
GLOVE BIOGEL PI INDICATOR 7.5 (GLOVE) ×2
GLOVE ECLIPSE 7.5 STRL STRAW (GLOVE) ×2 IMPLANT
GOWN STRL REUS W/TWL LRG LVL3 (GOWN DISPOSABLE) ×6 IMPLANT
KIT ABG SYR 3ML LUER SLIP (SYRINGE) IMPLANT
NEEDLE HYPO 25X5/8 SAFETYGLIDE (NEEDLE) IMPLANT
NS IRRIG 1000ML POUR BTL (IV SOLUTION) ×2 IMPLANT
PACK C SECTION WH (CUSTOM PROCEDURE TRAY) ×2 IMPLANT
PAD OB MATERNITY 4.3X12.25 (PERSONAL CARE ITEMS) ×2 IMPLANT
PENCIL SMOKE EVAC W/HOLSTER (ELECTROSURGICAL) ×2 IMPLANT
RTRCTR C-SECT PINK 25CM LRG (MISCELLANEOUS) ×2 IMPLANT
SPONGE LAP 18X18 X RAY DECT (DISPOSABLE) ×2 IMPLANT
STRIP CLOSURE SKIN 1/2X4 (GAUZE/BANDAGES/DRESSINGS) ×2 IMPLANT
SUT PLAIN 2 0 (SUTURE) ×2
SUT PLAIN ABS 2-0 CT1 27XMFL (SUTURE) ×1 IMPLANT
SUT VIC AB 0 CTX 36 (SUTURE) ×6
SUT VIC AB 0 CTX36XBRD ANBCTRL (SUTURE) ×3 IMPLANT
SUT VIC AB 2-0 CT1 27 (SUTURE) ×2
SUT VIC AB 2-0 CT1 TAPERPNT 27 (SUTURE) ×1 IMPLANT
SUT VIC AB 4-0 KS 27 (SUTURE) ×2 IMPLANT
TOWEL OR 17X24 6PK STRL BLUE (TOWEL DISPOSABLE) ×2 IMPLANT
TRAY FOLEY W/BAG SLVR 14FR LF (SET/KITS/TRAYS/PACK) ×2 IMPLANT
WATER STERILE IRR 1000ML POUR (IV SOLUTION) ×2 IMPLANT

## 2020-05-24 NOTE — Progress Notes (Addendum)
Patient ID: Linda Holmes, female   DOB: 12/10/1990, 30 y.o.   MRN: [id number]  Having recurrent variable decels, not improved with terbutaline or amnioinfusion. Will progress to cesarean section.  The risks of cesarean section discussed with the patient included but were not limited to: bleeding which may require transfusion or reoperation; infection which may require antibiotics; injury to bowel, bladder, ureters or other surrounding organs; injury to the fetus; need for additional procedures including hysterectomy in the event of a life-threatening hemorrhage; placental abnormalities wth subsequent pregnancies, incisional problems, thromboembolic phenomenon and other postoperative/anesthesia complications. The patient concurred with the proposed plan, giving informed written consent for the procedure.   Patient has been NPO since Ancef and azithromycin she will remain NPO for procedure. Anesthesia and OR aware.  Preoperative prophylactic Ancef ordered on call to the OR.  To OR when ready.  Levie Heritage, DO 05/24/2020 2:18 PM

## 2020-05-24 NOTE — Progress Notes (Signed)
Linda Holmes is a [name] at [redacted]w[redacted]d   Subjective: Cat I tracing, s/p IV Fentanyl. Improved coping but continues to be tearful and vocalize during contractions.  Objective: BP 113/76   Pulse 84   Temp 98.1 F (36.7 C) (Oral)   Resp 16   Ht 5\' 3"  (1.6 m)   Wt 92.7 kg   LMP 08/17/2019 (Exact Date)   BMI 36.21 kg/m  No intake/output data recorded. No intake/output data recorded.  FHT:  FHR: 125 bpm, variability: moderate,  accelerations:  Present,  decelerations:  Absent UC:   irregular, every 1-7 minutes SVE:   Dilation: 1 Effacement (%): Thick Station: -3 Exam by:: Dr. [email or web address]  Labs: Lab Results  Component Value Date   WBC 10.6 (H) 05/24/2020   HGB 9.2 (L) 05/24/2020   HCT 31.2 (L) 05/24/2020   MCV 69.5 (L) 05/24/2020   PLT 304 05/24/2020    Assessment / Plan: --[name] at [redacted]w[redacted]d  --Continues to desire TOLAC --Cat I tracing --GBS neg --At bedside for foley balloon placement. SROM, meconium stained fluid --3/80/-2 per my exam --Will assess for spontaneous change given SROM --IV Fentanyl PRN --Anticipate vaginal delivery, NICU present for delivery given meconium  08-22-1985, CNM 05/24/2020, 10:58 AM

## 2020-05-24 NOTE — Progress Notes (Signed)
Linda Holmes is a [name] at [redacted]w[redacted]d   Subjective: Denies pain or pressure s/p epidural  Objective: BP 115/73   Pulse 81   Temp 98.1 F (36.7 C) (Oral)   Resp 16   Ht 5\' 3"  (1.6 m)   Wt 92.7 kg   LMP 08/17/2019 (Exact Date)   SpO2 100%   BMI 36.21 kg/m  No intake/output data recorded. No intake/output data recorded.  FHT:  FHR: 125 bpm, variability: moderate,  accelerations:  Present,  decelerations:  Present Variable UC:   irregular, every 2-4 minutes SVE:   Dilation: 3 Effacement (%): 80,90 Station: -3 Exam by:: Sam CNM  Labs: Lab Results  Component Value Date   WBC 10.6 (H) 05/24/2020   HGB 9.2 (L) 05/24/2020   HCT 31.2 (L) 05/24/2020   MCV 69.5 (L) 05/24/2020   PLT 304 05/24/2020    Assessment / Plan: IUPC and FSE placed Amnioinfusion ordered Cervix 3.5/90/-3 Recurrent variable decels S/p SROM with MSF Advised repositioning to throne or high fowler's to improve engagement if no impact to tracing  Pt continues to desire TOLAC Anticipate NSVD  06-08-1992, CNM 05/24/2020, 12:25 PM

## 2020-05-24 NOTE — Anesthesia Preprocedure Evaluation (Signed)
Anesthesia Evaluation    Reviewed: Allergy & Precautions, Patient's Chart, lab work & pertinent test results  Airway Mallampati: II  TM Distance: >3 FB Neck ROM: Full    Dental no notable dental hx.    Pulmonary neg pulmonary ROS,    Pulmonary exam normal breath sounds clear to auscultation       Cardiovascular negative cardio ROS Normal cardiovascular exam Rhythm:Regular Rate:Normal     Neuro/Psych negative neurological ROS  negative psych ROS   GI/Hepatic negative GI ROS, Neg liver ROS,   Endo/Other  Obesity BMI 36  Renal/GU negative Renal ROS  negative genitourinary   Musculoskeletal negative musculoskeletal ROS (+)   Abdominal (+) + obese,   Peds  Hematology  (+) Blood dyscrasia, anemia , 9.1/31.2, plt 304   Anesthesia Other Findings   Reproductive/Obstetrics (+) Pregnancy 1 prior delivery 2020- c section 2/2 NRFHT- had failed epidural that was replaced once and subsequently functioned well Currently have long variable decels after ROM w/ meconium                             Anesthesia Physical Anesthesia Plan  ASA: III and emergent  Anesthesia Plan: Epidural   Post-op Pain Management:    Induction:   PONV Risk Score and Plan: 2  Airway Management Planned: Natural Airway  Additional Equipment: None  Intra-op Plan:   Post-operative Plan:   Informed Consent: I have reviewed the patients History and Physical, chart, labs and discussed the procedure including the risks, benefits and alternatives for the proposed anesthesia with the patient or authorized representative who has indicated his/her understanding and acceptance.       Plan Discussed with:   Anesthesia Plan Comments:         Anesthesia Quick Evaluation

## 2020-05-24 NOTE — Progress Notes (Signed)
Patient has result on her phone from [employer] of positive Covid on 03/23/20.

## 2020-05-24 NOTE — Op Note (Addendum)
Cesarean Section Operative Report  PATIENT: Linda Holmes  PROCEDURE DATE: 05/24/2020  PREOPERATIVE DIAGNOSES: Intrauterine pregnancy at [redacted]w[redacted]d weeks gestation; non-reassuring fetal status, failed TOLAC   POSTOPERATIVE DIAGNOSES: The same  PROCEDURE: Repeat Low Transverse Cesarean Section  SURGEON:   Surgeon(s) and Role:    * Levie Heritage, DO - Primary - Attending   Marcy Siren, DO- OB Fellow   INDICATIONS: Linda Holmes is a [name] at [redacted]w[redacted]d here for cesarean section secondary to the indications listed under preoperative diagnoses; please see preoperative note for further details.  The risks of cesarean section were discussed with the patient including but were not limited to: bleeding which may require transfusion or reoperation; infection which may require antibiotics; injury to bowel, bladder, ureters or other surrounding organs; injury to the fetus; need for additional procedures including hysterectomy in the event of a life-threatening hemorrhage; placental abnormalities wth subsequent pregnancies, incisional problems, thromboembolic phenomenon and other postoperative/anesthesia complications.   The patient concurred with the proposed plan, giving informed written consent for the procedure.    FINDINGS:  Viable female infant in cephalic presentation, direct OP. Nuchal cord x2.  Apgars 9 and 9.  Moderate meconium stained amniotic fluid.  Intact placenta, three vessel cord.  Normal uterus, fallopian tubes and ovaries bilaterally.  ANESTHESIA: Epidural INTRAVENOUS FLUIDS: 3000 cc  ESTIMATED BLOOD LOSS: 501 cc  URINE OUTPUT:  300 ml SPECIMENS: Placenta sent to L&D COMPLICATIONS: None immediate  PROCEDURE IN DETAIL:  The patient preoperatively received intravenous antibiotics and had sequential compression devices applied to her lower extremities.  She was then taken to the operating room where the epidural anesthesia was dosed up to surgical level and was found to be  adequate. She was then placed in a dorsal supine position with a leftward tilt, and prepped and draped in a sterile manner.  A foley catheter was placed into her bladder and attached to constant gravity.    After an adequate timeout was performed, a Pfannenstiel skin incision was made with scalpel over her preexisting scar and carried through to the underlying layer of fascia. The fascia was incised in the midline, and this incision was extended bilaterally using the Mayo scissors.  Kocher clamps were applied to the superior aspect of the fascial incision and the underlying rectus muscles were dissected off bluntly.  A similar process was carried out on the inferior aspect of the fascial incision. The rectus muscles were separated in the midline bluntly and the peritoneum was entered bluntly. Attention was turned to the lower uterine segment where a low transverse hysterotomy was made with a scalpel and extended bilaterally bluntly.  The infant was successfully delivered, the cord was clamped and cut after one minute, and the infant was handed over to the awaiting neonatology team. Uterine massage was then administered, and the placenta delivered intact with a three-vessel cord. The uterus was then cleared of clots and debris.  The hysterotomy was closed with 0 Vicryl in a running locked fashion, and an imbricating layer was also placed with 0 Vicryl.  Figure-of-eight 0 Vicryl serosal stitches were placed to help with hemostasis.  The pelvis was cleared of all clot and debris. Hemostasis was confirmed on all surfaces.  The peritoneum was closed with a 0 Vicryl running stitch. The fascia was then closed using 0 Vicryl in a running fashion.  The subcutaneous layer was irrigated, then reapproximated with 2-0 plain gut interrupted stitches.  The skin was closed with a 4-0 Vicryl subcuticular  stitch.   The patient tolerated the procedure well. Sponge, lap, instrument and needle counts were correct x 3.  She was taken  to the recovery room in stable condition.   An experienced assistant was required given the standard of surgical care given the complexity of the case.  This assistant was needed for exposure, dissection, suctioning, retraction, instrument exchange, assisting with delivery with administration of fundal pressure, and for overall help during the procedure.   Maternal Disposition: PACU - hemodynamically stable.   Infant Disposition: stable   Marcy Siren, D.O. OB Fellow  05/24/2020, 3:53 PM    Attestation of Attending Supervision of Obstetric Fellow During Surgery: Surgery was performed by the Obstetric Fellow under my supervision and collaboration.  I have reviewed the Obstetric Fellow's operative report, and I agree with the documentation.  I was present and scrubbed for the entire procedure.    Candelaria Celeste, DO Attending Physician Faculty Practice, [facility]

## 2020-05-24 NOTE — Anesthesia Procedure Notes (Signed)
Epidural Patient location during procedure: OB Start time: 05/24/2020 11:34 AM End time: 05/24/2020 11:42 AM  Staffing Anesthesiologist: Lannie Fields, DO Performed: anesthesiologist   Preanesthetic Checklist Completed: patient identified, IV checked, risks and benefits discussed, monitors and equipment checked, pre-op evaluation and timeout performed  Epidural Patient position: sitting Prep: DuraPrep and site prepped and draped Patient monitoring: continuous pulse ox, blood pressure, heart rate and cardiac monitor Approach: midline Location: L3-L4 Injection technique: LOR air  Needle:  Needle type: Tuohy  Needle gauge: 17 G Needle length: 9 cm Needle insertion depth: 8 cm Catheter type: closed end flexible Catheter size: 19 Gauge Catheter at skin depth: 14 cm Test dose: negative  Assessment Sensory level: T8 Events: blood not aspirated, injection not painful, no injection resistance, no paresthesia and negative IV test  Additional Notes Patient identified. Risks/Benefits/Options discussed with patient including but not limited to bleeding, infection, nerve damage, paralysis, failed block, incomplete pain control, headache, blood pressure changes, nausea, vomiting, reactions to medication both or allergic, itching and postpartum back pain. Confirmed with bedside nurse the patient's most recent platelet count. Confirmed with patient that they are not currently taking any anticoagulation, have any bleeding history or any family history of bleeding disorders. Patient expressed understanding and wished to proceed. All questions were answered. Sterile technique was used throughout the entire procedure. Please see nursing notes for vital signs. Test dose was given through epidural catheter and negative prior to continuing to dose epidural or start infusion. Warning signs of high block given to the patient including shortness of breath, tingling/numbness in hands, complete motor  block, or any concerning symptoms with instructions to call for help. Patient was given instructions on fall risk and not to get out of bed. All questions and concerns addressed with instructions to call with any issues or inadequate analgesia.  Reason for block:procedure for pain

## 2020-05-24 NOTE — H&P (Addendum)
OBSTETRIC ADMISSION HISTORY AND PHYSICAL  Linda Holmes is a 30 y.o. female G2P1001 with IUP at [redacted]w[redacted]d by LMP presenting for SOL. She reports +FMs, No LOF, no VB, no blurry vision, headaches or peripheral edema, and RUQ pain.  She plans on breast feeding. She request nothing for birth control. She received her prenatal care at [facility]   Dating: By LMP --->  Estimated Date of Delivery: 05/23/20  Sono:    @[redacted]w[redacted]d , CWD, normal anatomy, cephalic presentation, Anterior placenta,   [redacted]w[redacted]d 575g, 39% EFW   Prenatal History/Complications: Thalassemia trait, history of C-section.  Patient with history of C-section for nonreassuring fetal heart tones.  She dilated to 7 cm and fetus cannot tolerate further labor.  Past Medical History: Past Medical History:  Diagnosis Date  . Anemia   . Medical history non-contributory     Past Surgical History: Past Surgical History:  Procedure Laterality Date  . CESAREAN SECTION N/A 11/24/2018   Procedure: CESAREAN SECTION;  Surgeon: 11/26/2018, MD;  Location: [facility] LD ORS;  Service: Obstetrics;  Laterality: N/A;    Obstetrical History: OB History    Gravida  2   Para  1   Term  1   Preterm      AB      Living  1     SAB      IAB      Ectopic      Multiple  0   Live Births  1           Social History Social History   Socioeconomic History  . Marital status: Single    Spouse name: Not on file  . Number of children: Not on file  . Years of education: 74  . Highest education level: Bachelor's degree (e.g., BA, AB, BS)  Occupational History  . Not on file  Tobacco Use  . Smoking status: Never Smoker  . Smokeless tobacco: Never Used  Vaping Use  . Vaping Use: Never used  Substance and Sexual Activity  . Alcohol use: Not Currently    Comment: every weekend  . Drug use: Never  . Sexual activity: Yes    Birth control/protection: None  Other Topics Concern  . Not on file  Social History Narrative  . Not on file    Social Determinants of Health   Financial Resource Strain: Not on file  Food Insecurity: No Food Insecurity  . Worried About 12 in the Last Year: Never true  . Ran Out of Food in the Last Year: Never true  Transportation Needs: No Transportation Needs  . Lack of Transportation (Medical): No  . Lack of Transportation (Non-Medical): No  Physical Activity: Not on file  Stress: Not on file  Social Connections: Not on file    Family History: Family History  Problem Relation Age of Onset  . Hyperlipidemia Mother   . Bipolar disorder Sister   . Asthma Daughter     Allergies: No Known Allergies  Medications Prior to Admission  Medication Sig Dispense Refill Last Dose  . Prenatal Vit-Fe Fumarate-FA (PREPLUS) 27-1 MG TABS Take 1 tablet by mouth daily. 30 tablet 6 05/23/2020 at 1200  . acetaminophen (TYLENOL) 500 MG tablet Take 1,000 mg by mouth every 6 (six) hours as needed.   Unknown at Unknown time  . ferrous sulfate 325 (65 FE) MG EC tablet Take 325 mg by mouth. Once daily   Unknown at Unknown time     Review of  Systems   All systems reviewed and negative except as stated in HPI  Blood pressure 113/76, pulse 84, temperature 98.1 F (36.7 C), temperature source Oral, resp. rate 16, height 5\' 3"  (1.6 m), weight 92.7 kg, last menstrual period 08/17/2019, currently breastfeeding. General appearance: alert, cooperative and moderate distress Lungs: clear to auscultation bilaterally Heart: regular rate and rhythm Abdomen: soft, non-tender; bowel sounds normal Extremities: Homans sign is negative, no sign of DVT Presentation: cephalic Fetal monitoringBaseline: 125 bpm, Variability: Good {> 6 bpm), Accelerations: Reactive and Decelerations: Variable: moderate Uterine activity Difficulty tracing initially  Dilation: 1 Effacement (%): Thick Station: -3 Exam by:: Dr. [email or web address]   Prenatal labs: ABO, Rh: --/--/A POS (03/22 0744) Antibody: NEG (03/22  0744) Rubella: 1.67 (09/03 1149) RPR: Non Reactive (12/27 0844)  HBsAg: Negative (09/03 1149)  HIV: Non Reactive (12/27 0844)  GBS: Negative/-- (02/24 1010)  1 hr Glucola Neg  Genetic screening  Normal  Anatomy 07-10-1995 Normal   Prenatal Transfer Tool  Maternal Diabetes: No Genetic Screening: Normal Maternal Ultrasounds/Referrals: Normal Fetal Ultrasounds or other Referrals:  Fetal echo Maternal Substance Abuse:  No Significant Maternal Medications:  None Significant Maternal Lab Results: Group B Strep negative  Results for orders placed or performed during the hospital encounter of 05/24/20 (from the past 24 hour(s))  CBC   Collection Time: 05/24/20  7:07 AM  Result Value Ref Range   WBC 10.6 (H) 4.0 - 10.5 K/uL   RBC 4.49 3.87 - 5.11 MIL/uL   Hemoglobin 9.2 (L) 12.0 - 15.0 g/dL   HCT 05/26/20 (L) 06.2 - 37.6 %   MCV 69.5 (L) 80.0 - 100.0 fL   MCH 20.5 (L) 26.0 - 34.0 pg   MCHC 29.5 (L) 30.0 - 36.0 g/dL   RDW 28.3 (H) 15.1 - 76.1 %   Platelets 304 150 - 400 K/uL   nRBC 0.0 0.0 - 0.2 %  Type and screen   Collection Time: 05/24/20  7:44 AM  Result Value Ref Range   ABO/RH(D) A POS    Antibody Screen NEG    Sample Expiration      05/27/2020,2359 Performed at [facility], [address], [place], [place]     Patient Active Problem List   Diagnosis Date Noted  . Post term pregnancy at [redacted] weeks gestation 05/24/2020  . COVID-19 03/30/2020  . Supervision of high risk pregnancy, antepartum 11/04/2019  . Short interval between pregnancies affecting pregnancy, antepartum 11/04/2019  . Obesity in pregnancy 11/04/2019  . History of cesarean delivery 12/23/2018  . Thalassemia trait 11/24/2018    Assessment/Plan:  Linda Holmes is a [name] at [redacted]w[redacted]d here for SOL.   #Labor: Patient arrived and was extremely uncomfortable, cervix was 1 cm dilated and unable to place Foley balloon.  See progress notes. #Pain: Epidural #FWB: Initially category 1 but currently  category 2 and patient is going for C-section #ID:  GBS negative #MOF: Breast #MOC: None  [redacted]w[redacted]d, MD  05/24/2020, 10:41 AM  Attestation of Supervision of Student:  I confirm that I have verified the information documented in the resident's note and that I have also personally reperformed the history, physical exam and all medical decision making activities.  I have verified that all services and findings are accurately documented in this student's note; and I agree with management and plan as outlined in the documentation. I have also made any necessary editorial changes.   05/26/2020, CNM [facility], [facility] 05/24/2020 2:33 PM

## 2020-05-24 NOTE — Progress Notes (Signed)
At bedside to assess for foley bulb placement s/p vaginal exam by Dr. Nobie Putnam. Patient continues to desire TOLAC. Previous IV access infiltrated. RNs at bedside to attempt new line. Patient crying with contractions. Cat I tracing. Will defer foley attempt until IV access obtained, IV Fentanyl on board.   Linda Bibles, MSN, CNM Certified Nurse Midwife, [employer] for [employer], [facility] 05/24/20 10:26 AM

## 2020-05-24 NOTE — MAU Note (Signed)
Pt with c/o small amount of bleeding from small bump near her previous C/S incision earlier today.

## 2020-05-24 NOTE — Anesthesia Postprocedure Evaluation (Signed)
Anesthesia Post Note  Patient: Linda Holmes  Procedure(s) Performed: CESAREAN SECTION (N/A )     Patient location during evaluation: PACU Anesthesia Type: Epidural Level of consciousness: oriented and awake and alert Pain management: pain level controlled Vital Signs Assessment: post-procedure vital signs reviewed and stable Respiratory status: spontaneous breathing and respiratory function stable Cardiovascular status: blood pressure returned to baseline and stable Postop Assessment: no headache, no backache, no apparent nausea or vomiting, patient able to bend at knees and epidural receding Anesthetic complications: no   No complications documented.  Last Vitals:  Vitals:   05/24/20 1615 05/24/20 1630  BP: 119/73 123/70  Pulse: (!) 109 96  Resp: 16 15  Temp:    SpO2: 99% 100%    Last Pain:  Vitals:   05/24/20 1630  TempSrc:   PainSc: 3    Pain Goal: Patients Stated Pain Goal: 5 (05/24/20 1630)              Epidural/Spinal Function Cutaneous sensation: Able to Discern Pressure (05/24/20 1600), Patient able to flex knees: Yes (05/24/20 1600), Patient able to lift hips off bed: No (05/24/20 1600), Back pain beyond tenderness at insertion site: No (05/24/20 1600), Progressively worsening motor and/or sensory loss: No (05/24/20 1600), Bowel and/or bladder incontinence post epidural: No (05/24/20 1600)  Lannie Fields

## 2020-05-24 NOTE — MAU Note (Signed)
Pt with c/o contractions since 0330 q 4 minutes.  Denies SROM, bleeding, HA, Dizziness or blurry vision.  +FM per pt.

## 2020-05-24 NOTE — Progress Notes (Signed)
MARCIEL OFFENBERGER is a [name] at [redacted]w[redacted]d  Subjective: Tearful during contractions, little observable relief with IV Fentanyl  Objective: BP (!) 116/58   Pulse 85   Temp 98.1 F (36.7 C) (Oral)   Resp 16   Ht 5\' 3"  (1.6 m)   Wt 92.7 kg   LMP 08/17/2019 (Exact Date)   BMI 36.21 kg/m  No intake/output data recorded. No intake/output data recorded.  FHT:  FHR: 125 bpm, variability: moderate,  accelerations:  Present,  decelerations:  Present Variable UC:   irregular, every 2-5 minutes SVE:   Dilation: 3 Effacement (%): 80,90 Station: -1 Exam by:: Sam CNM  Labs: Lab Results  Component Value Date   WBC 10.6 (H) 05/24/2020   HGB 9.2 (L) 05/24/2020   HCT 31.2 (L) 05/24/2020   MCV 69.5 (L) 05/24/2020   PLT 304 05/24/2020    Assessment / Plan: CNM at bedside for deep variables s/p SROM with MSF Reassuring variability, return to baseline Advised Amnioinfusion. Placement attempted. Pt unable to tolerate Pt to have epidural placed, will reattempt Amnioinfusion s/p epidural Update provided to Dr. 05/26/2020 05/24/2020, 11:28 AM

## 2020-05-24 NOTE — Progress Notes (Signed)
Pt up and out of bed to bathroom with slow and steady gait.  Low pain of 3 with activity that pt reports is tolerable.  Peri care given with instructions.  Pt demonstrated understanding.  Foley emptied of 200 ml clear, yellow urine. Pt back to bed without incident.

## 2020-05-24 NOTE — Transfer of Care (Signed)
Immediate Anesthesia Transfer of Care Note  Patient: Linda Holmes  Procedure(s) Performed: CESAREAN SECTION (N/A )  Patient Location: PACU  Anesthesia Type:Epidural  Level of Consciousness: awake, alert  and oriented  Airway & Oxygen Therapy: Patient Spontanous Breathing  Post-op Assessment: Report given to RN and Post -op Vital signs reviewed and stable  Post vital signs: Reviewed and stable  Last Vitals:  Vitals Value Taken Time  BP 106/94 05/24/20 1600  Temp    Pulse 115 05/24/20 1604  Resp 19 05/24/20 1604  SpO2 98 % 05/24/20 1604  Vitals shown include unvalidated device data.  Last Pain:  Vitals:   05/24/20 1155  TempSrc:   PainSc: 0-No pain      Patients Stated Pain Goal: 8 (05/24/20 1121)  Complications: No complications documented.

## 2020-05-25 LAB — CBC
HCT: 24.2 % — ABNORMAL LOW (ref 36.0–46.0)
Hemoglobin: 7.7 g/dL — ABNORMAL LOW (ref 12.0–15.0)
MCH: 21.6 pg — ABNORMAL LOW (ref 26.0–34.0)
MCHC: 31.8 g/dL (ref 30.0–36.0)
MCV: 67.8 fL — ABNORMAL LOW (ref 80.0–100.0)
Platelets: 280 10*3/uL (ref 150–400)
RBC: 3.57 MIL/uL — ABNORMAL LOW (ref 3.87–5.11)
RDW: 17.4 % — ABNORMAL HIGH (ref 11.5–15.5)
WBC: 14.9 10*3/uL — ABNORMAL HIGH (ref 4.0–10.5)
nRBC: 0 % (ref 0.0–0.2)

## 2020-05-25 LAB — CREATININE, SERUM
Creatinine, Ser: 0.68 mg/dL (ref 0.44–1.00)
GFR, Estimated: >60 mL/min (ref >60)

## 2020-05-25 MED ORDER — SODIUM CHLORIDE 0.9 % IV SOLN
500.0000 mg | Freq: Once | INTRAVENOUS | Status: AC
  Administered 2020-05-25: 500 mg via INTRAVENOUS
  Filled 2020-05-25: qty 25

## 2020-05-25 MED ORDER — FERROUS SULFATE 325 (65 FE) MG PO TABS
325.0000 mg | ORAL_TABLET | ORAL | Status: DC
  Administered 2020-05-26: 325 mg via ORAL
  Filled 2020-05-25: qty 1

## 2020-05-25 NOTE — Plan of Care (Signed)
  Problem: Education: Goal: Ability to state and carry out methods to decrease the pain will improve Outcome: Progressing   Problem: Education: Goal: Ability to make informed decisions regarding treatment and plan of care will improve Outcome: Progressing  Pt is progressing well.  Pain is controlled with tylenol and toradol.  No narcotics have been taken as of yet.  Pt is bonding with infant and breastfeeding well. Has ambulated to bathroom for pericare and had little complaints of pain.  Back to bed without incident.

## 2020-05-25 NOTE — Progress Notes (Signed)
Subjective: Postpartum Day 1: Cesarean Delivery Eating, drinking, ambulating well.  +flatus.  Lochia and pain wnl.  Denies dizziness, lightheadedness, or sob. No complaints.   Hasn't voided yet since foley removed this am.  Is OK w/ receiving IV Venofer for asymptomatic anemia.  Objective: Vital signs in last 24 hours: Temp:  [97.8 F (36.6 C)-98.6 F (37 C)] 98.6 F (37 C) (03/23 0500) Pulse Rate:  [71-157] 85 (03/23 0500) Resp:  [14-22] 18 (03/23 0500) BP: (102-145)/(51-99) 120/70 (03/23 0500) SpO2:  [97 %-100 %] 100 % (03/23 0151)  Physical Exam:  General: alert, cooperative and no distress Lochia: appropriate Uterine Fundus: firm Incision: healing well, no significant drainage, no dehiscence, no significant erythema DVT Evaluation: No evidence of DVT seen on physical exam. Negative Homan's sign. No cords or calf tenderness. No significant calf/ankle edema.  Recent Labs    05/24/20 0707 05/25/20 0518  HGB 9.2* 7.7*  HCT 31.2* 24.2*    Assessment/Plan: Status post Cesarean section. Doing well postoperatively.  IV Venofer 500mg  x1, then po Fe QOD.  05/25/2020, 9:06 AM

## 2020-05-25 NOTE — Progress Notes (Signed)
POSTPARTUM PROGRESS NOTE  Subjective: Linda Holmes is a 30 y.o. [id number] s/p LTCS at [redacted]w[redacted]d.  She reports she doing well. No acute events overnight. She denies any problems with ambulating, voiding or po intake. Reports nausea and an episode of vomiting last night with no complaints of N/V this morning. She has not passed flatus. Pain is well controlled.  Lochia is appropiate.  Objective: Blood pressure 120/70, pulse 85, temperature 98.6 F (37 C), temperature source Oral, resp. rate 18, height 5\' 3"  (1.6 m), weight 92.7 kg, last menstrual period 08/17/2019, SpO2 100 %, unknown if currently breastfeeding.  Physical Exam:  General: alert, cooperative and no distress Chest: no respiratory distress Abdomen: soft, non-tender  Uterine Fundus: firm and at level of umbilicus Extremities: No calf swelling or tenderness  no edema  Recent Labs    05/24/20 0707 05/25/20 0518  HGB 9.2* 7.7*  HCT 31.2* 24.2*    Assessment/Plan: Linda Holmes is a 30 y.o. 26 s/p LTCS at [redacted]w[redacted]d after failed TOLAC.  Routine Postpartum Care: Doing well, pain well-controlled.  -- Continue routine care, lactation support  -- Contraception: family planning -- Feeding: breast -- Blood loss anemia: Asymptomatic. Hgb on admission 9.2, this morning hgb 7.7. Venofer IV infusion.  Dispo: Plan for discharge tomorrow. Patient is agreeable to plan and all questions have been answered.  [redacted]w[redacted]d 05/25/2020 7:59 AM

## 2020-05-26 MED ORDER — IBUPROFEN 600 MG PO TABS: 600.0000 mg | ORAL_TABLET | Freq: Four times a day (QID) | ORAL | Status: DC | PRN

## 2020-05-26 MED ORDER — COCONUT OIL OIL: 1.0000 "application " | TOPICAL_OIL | 0 refills | Status: DC | PRN

## 2020-05-26 MED ORDER — OXYCODONE HCL 5 MG PO TABS: 5.0000 mg | ORAL_TABLET | ORAL | 0 refills | Status: DC | PRN

## 2020-05-26 MED ORDER — FERROUS SULFATE 325 (65 FE) MG PO TBEC: 325.0000 mg | DELAYED_RELEASE_TABLET | ORAL | 3 refills | Status: DC

## 2020-05-26 NOTE — Discharge Instructions (Signed)
-take tylenol 1000 mg every 6 hours as needed for pain, alternate with ibuprofen 600 mg every 6 hours -take oxycodone as needed if tylenol and ibuprofen aren't working -drink plenty of water to help with breastfeeding -continue prenatal vitamins while you are breastfeeding -take iron pills every other day with vitamin c, this will help healing as well as breast feeding -think about birth control options-->bedisider.org is a great website! You can get any form of birth control from the health department for free   Postpartum Care After Cesarean Delivery This sheet gives you information about how to care for yourself from the time you deliver your baby to up to 6-12 weeks after delivery (postpartum period). Your health care provider may also give you more specific instructions. If you have problems or questions, contact your health care provider. Follow these instructions at home: Medicines  Take over-the-counter and prescription medicines only as told by your health care provider.  If you were prescribed an antibiotic medicine, take it as told by your health care provider. Do not stop taking the antibiotic even if you start to feel better.  Ask your health care provider if the medicine prescribed to you: ? Requires you to avoid driving or using heavy machinery. ? Can cause constipation. You may need to take actions to prevent or treat constipation, such as:  Drink enough fluid to keep your urine pale yellow.  Take over-the-counter or prescription medicines.  Eat foods that are high in fiber, such as beans, whole grains, and fresh fruits and vegetables.  Limit foods that are high in fat and processed sugars, such as fried or sweet foods. Activity  Gradually return to your normal activities as told by your health care provider.  Avoid activities that take a lot of effort and energy (are strenuous) until approved by your health care provider. Walking at a slow to moderate pace is usually  safe. Ask your health care provider what activities are safe for you. ? Do not lift anything that is heavier than your baby or 10 lb (4.5 kg) as told by your health care provider. ? Do not vacuum, climb stairs, or drive a car for as long as told by your health care provider.  If possible, have someone help you at home until you are able to do your usual activities yourself.  Rest as much as possible. Try to rest or take naps while your baby is sleeping. Vaginal bleeding  It is normal to have vaginal bleeding (lochia) after delivery. Wear a sanitary pad to absorb vaginal bleeding and discharge. ? During the first week after delivery, the amount and appearance of lochia is often similar to a menstrual period. ? Over the next few weeks, it will gradually decrease to a dry, yellow-brown discharge. ? For most women, lochia stops completely by 4-6 weeks after delivery. Vaginal bleeding can vary from woman to woman.  Change your sanitary pads frequently. Watch for any changes in your flow, such as: ? A sudden increase in volume. ? A change in color. ? Large blood clots.  If you pass a blood clot, save it and call your health care provider to discuss. Do not flush blood clots down the toilet before you get instructions from your health care provider.  Do not use tampons or douches until your health care provider says this is safe.  If you are not breastfeeding, your period should return 6-8 weeks after delivery. If you are breastfeeding, your period may return anytime between 8   weeks after delivery and the time that you stop breastfeeding. Perineal care  If your C-section (Cesarean section) was unplanned, and you were allowed to labor and push before delivery, you may have pain, swelling, and discomfort of the tissue between your vaginal opening and your anus (perineum). You may also have an incision in the tissue (episiotomy) or the tissue may have torn during delivery. Follow these instructions  as told by your health care provider: ? Keep your perineum clean and dry as told by your health care provider. Use medicated pads and pain-relieving sprays and creams as directed. ? If you have an episiotomy or vaginal tear, check the area every day for signs of infection. Check for:  Redness, swelling, or pain.  Fluid or blood.  Warmth.  Pus or a bad smell. ? You may be given a squirt bottle to use instead of wiping to clean the perineum area after you go to the bathroom. As you start healing, you may use the squirt bottle before wiping yourself. Make sure to wipe gently. ? To relieve pain caused by an episiotomy, vaginal tear, or hemorrhoids, try taking a warm sitz bath 2-3 times a day. A sitz bath is a warm water bath that is taken while you are sitting down. The water should only come up to your hips and should cover your buttocks.   Breast care  Within the first few days after delivery, your breasts may feel heavy, full, and uncomfortable (breast engorgement). You may also have milk leaking from your breasts. Your health care provider can suggest ways to help relieve breast discomfort. Breast engorgement should go away within a few days.  If you are breastfeeding: ? Wear a bra that supports your breasts and fits you well. ? Keep your nipples clean and dry. Apply creams and ointments as told by your health care provider. ? You may need to use breast pads to absorb milk leakage. ? You may have uterine contractions every time you breastfeed for several weeks after delivery. Uterine contractions help your uterus return to its normal size. ? If you have any problems with breastfeeding, work with your health care provider or a lactation consultant.  If you are not breastfeeding: ? Avoid touching your breasts as this can make your breasts produce more milk. ? Wear a well-fitting bra and use cold packs to help with swelling. ? Do not squeeze out (express) milk. This causes you to make more  milk. Intimacy and sexuality  Ask your health care provider when you can engage in sexual activity. This may depend on your: ? Risk of infection. ? Healing rate. ? Comfort and desire to engage in sexual activity.  You are able to get pregnant after delivery, even if you have not had your period. If desired, talk with your health care provider about methods of family planning or birth control (contraception). Lifestyle  Do not use any products that contain nicotine or tobacco, such as cigarettes, e-cigarettes, and chewing tobacco. If you need help quitting, ask your health care provider.  Do not drink alcohol, especially if you are breastfeeding. Eating and drinking  Drink enough fluid to keep your urine pale yellow.  Eat high-fiber foods every day. These may help prevent or relieve constipation. High-fiber foods include: ? Whole grain cereals and breads. ? Brown rice. ? Beans. ? Fresh fruits and vegetables.  Take your prenatal vitamins until your postpartum checkup or until your health care provider tells you it is okay to stop.     General instructions  Keep all follow-up visits for you and your baby as told by your health care provider. Most women visit their health care provider for a postpartum checkup within the first 3-6 weeks after delivery. Contact a health care provider if you:  Feel unable to cope with the changes that a new baby brings to your life, and these feelings do not go away.  Feel unusually sad or worried.  Have breasts that are painful, hard, or turn red.  Have a fever.  Have trouble holding urine or keeping urine from leaking.  Have little or no interest in activities you used to enjoy.  Have not breastfed at all and you have not had a menstrual period for 12 weeks after delivery.  Have stopped breastfeeding and you have not had a menstrual period for 12 weeks after you stopped breastfeeding.  Have questions about caring for yourself or your  baby.  Pass a blood clot from your vagina. Get help right away if you:  Have chest pain.  Have difficulty breathing.  Have sudden, severe leg pain.  Have severe pain or cramping in your abdomen.  Bleed from your vagina so much that you fill more than one sanitary pad in one hour. Bleeding should not be heavier than your heaviest period.  Develop a severe headache.  Faint.  Have blurred vision or spots in your vision.  Have a bad-smelling vaginal discharge.  Have thoughts about hurting yourself or your baby. If you ever feel like you may hurt yourself or others, or have thoughts about taking your own life, get help right away. You can go to your nearest emergency department or call:  Your local emergency services (911 in the [place]  A suicide crisis helpline, such as the National Suicide Prevention Lifeline at 1-800-273-8255. This is open 24 hours a day. Summary  The period of time from when you deliver your baby to up to 6-12 weeks after delivery is called the postpartum period.  Gradually return to your normal activities as told by your health care provider.  Keep all follow-up visits for you and your baby as told by your health care provider. This information is not intended to replace advice given to you by your health care provider. Make sure you discuss any questions you have with your health care provider. Document Revised: 10/09/2017 Document Reviewed: 10/09/2017 Elsevier Patient Education  2021 Elsevier Inc.  

## 2020-05-26 NOTE — Discharge Summary (Signed)
Postpartum Discharge Summary  Date of Service updated 05/26/20     Patient Name: Linda Holmes DOB: 1990-11-24 MRN: [id number]  Date of admission: 05/24/2020 Delivery date:05/24/2020  Delivering provider: Truett Mainland  Date of discharge: 05/26/2020  Admitting diagnosis: Post term pregnancy at [redacted] weeks gestation [O48.0, Z3A.41] Intrauterine pregnancy: [redacted]w[redacted]d    Secondary diagnosis:  Active Problems:   Thalassemia trait   History of cesarean delivery   Supervision of high risk pregnancy, antepartum   Short interval between pregnancies affecting pregnancy, antepartum   Obesity in pregnancy   COVID-19   Post term pregnancy at [redacted] weeks gestation  Additional problems: none    Discharge diagnosis: Term Pregnancy Delivered and Anemia                                              Post partum procedures:venofer postpartum Augmentation: N/A Complications: None  Hospital course: Onset of Labor With Unplanned C/S   30y.o. yo G2P2002 at 422w1das admitted in Latent Labor on 05/24/2020. Patient had a labor course significant for NRFHT despite amnioinfusion. The patient went for cesarean section due to failed TOLAC. Delivery details as follows: Membrane Rupture Time/Date: 10:51 AM ,05/24/2020   Delivery Method:C-Section, Low Transverse  Details of operation can be found in separate operative note. Patient had an uncomplicated postpartum course.  She is ambulating,tolerating a regular diet, passing flatus, and urinating well.  Patient is discharged home in stable condition 05/26/20.  Newborn Data: Birth date:05/24/2020  Birth time:2:52 PM  Gender:Female  Living status:Living  Apgars:9 ,9  Weight:3080 g   Magnesium Sulfate received: No BMZ received: No Rhophylac:N/A MMR:N/A T-DaP:Given prenatally Flu: Yes Transfusion:No  Physical exam  Vitals:   05/25/20 0500 05/25/20 1430 05/25/20 2112 05/26/20 0525  BP: 120/70 122/76 116/66 112/72  Pulse: 85 78 75 92  Resp: 18 17 16 18    Temp: 98.6 F (37 C) 98.3 F (36.8 C) 97.6 F (36.4 C) 98.3 F (36.8 C)  TempSrc: Oral Oral Oral Oral  SpO2:  100% 100%   Weight:      Height:       General: alert, cooperative and no distress Lochia: appropriate Uterine Fundus: firm Incision: Dressing is clean, dry, and intact DVT Evaluation: No evidence of DVT seen on physical exam. Labs: Lab Results  Component Value Date   WBC 14.9 (H) 05/25/2020   HGB 7.7 (L) 05/25/2020   HCT 24.2 (L) 05/25/2020   MCV 67.8 (L) 05/25/2020   PLT 280 05/25/2020   CMP Latest Ref Rng & Units 05/25/2020  Glucose 65 - 99 mg/dL -  BUN 6 - 20 mg/dL -  Creatinine 0.44 - 1.00 mg/dL 0.68  Sodium 134 - 144 mmol/L -  Potassium 3.5 - 5.2 mmol/L -  Chloride 96 - 106 mmol/L -  CO2 20 - 29 mmol/L -  Calcium 8.7 - 10.2 mg/dL -  Total Protein 6.0 - 8.5 g/dL -  Total Bilirubin 0.0 - 1.2 mg/dL -  Alkaline Phos 48 - 121 IU/L -  AST 0 - 40 IU/L -  ALT 0 - 32 IU/L -   Edinburgh Score: Edinburgh Postnatal Depression Scale Screening Tool 05/24/2020  I have been able to laugh and see the funny side of things. 0  I have looked forward with enjoyment to things. 0  I have blamed myself unnecessarily when  things went wrong. 1  I have been anxious or worried for no good reason. 0  I have felt scared or panicky for no good reason. 0  Things have been getting on top of me. 0  I have been so unhappy that I have had difficulty sleeping. 0  I have felt sad or miserable. 0  I have been so unhappy that I have been crying. 0  The thought of harming myself has occurred to me. 0  Edinburgh Postnatal Depression Scale Total 1     After visit meds:  Allergies as of 05/26/2020   No Known Allergies     Medication List    TAKE these medications   acetaminophen 500 MG tablet Commonly known as: TYLENOL Take 1,000 mg by mouth every 6 (six) hours as needed.   coconut oil Oil Apply 1 application topically as needed.   ferrous sulfate 325 (65 FE) MG EC tablet Take  1 tablet (325 mg total) by mouth every other day. Once daily What changed: when to take this   ibuprofen 600 MG tablet Commonly known as: ADVIL Take 1 tablet (600 mg total) by mouth every 6 (six) hours as needed.   oxyCODONE 5 MG immediate release tablet Commonly known as: Oxy IR/ROXICODONE Take 1-2 tablets (5-10 mg total) by mouth every 4 (four) hours as needed for moderate pain.   PrePLUS 27-1 MG Tabs Take 1 tablet by mouth daily.        Discharge home in stable condition Infant Feeding: Breast Infant Disposition:home with mother Discharge instruction: per After Visit Summary and Postpartum booklet. Activity: Advance as tolerated. Pelvic rest for 6 weeks.  Diet: routine diet Future Appointments: Future Appointments  Date Time Provider [place]  05/28/2020 10:30 AM [facility] None   Follow up Visit: Message sent to [facility] 05/26/20 by Sylvester Harder.   Please schedule this patient for a In person postpartum visit in 6 weeks with the following provider: Any provider. Additional Postpartum F/U:Incision check 1 week  Low risk pregnancy complicated by: n/a Delivery mode:  C-Section, Low Transverse  Anticipated Birth Control:  NFP   9/37/9024 Arrie Senate, MD

## 2020-05-27 ENCOUNTER — Telehealth: Payer: Self-pay

## 2020-05-27 NOTE — Telephone Encounter (Signed)
Transition Care Management Follow-up Telephone Call  Date of discharge and from where: 05/26/2020 from [facility].   How have you been since you were released from the hospital? Pt states that she is doing well.   Any questions or concerns? No  Items Reviewed:  Did the pt receive and understand the discharge instructions provided? Yes   Medications obtained and verified? Yes   Other? No   Any new allergies since your discharge? No   Dietary orders reviewed? n/a  Do you have support at home? Yes   Functional Questionnaire: (I = Independent and D = Dependent) ADLs: I  Bathing/Dressing- I  Meal Prep- I  Eating- I  Maintaining continence- I  Transferring/Ambulation- I  Managing Meds- I   Follow up appointments reviewed:   PCP Hospital f/u appt confirmed? No    Specialist Hospital f/u appt confirmed? Yes  Scheduled to see [facility] Nurse on 06/02/2020 @ 09:00am.  Are transportation arrangements needed? No   If their condition worsens, is the pt aware to call PCP or go to the Emergency Dept.? Yes  Was the patient provided with contact information for the PCP's office or ED? Yes  Was to pt encouraged to call back with questions or concerns? Yes

## 2020-05-28 ENCOUNTER — Other Ambulatory Visit (HOSPITAL_COMMUNITY): Admission: RE | Admit: 2020-05-28 | Payer: Medicaid Other | Source: Ambulatory Visit

## 2020-05-30 ENCOUNTER — Inpatient Hospital Stay (HOSPITAL_COMMUNITY)
Admission: AD | Admit: 2020-05-30 | Payer: Medicaid Other | Source: Home / Self Care | Admitting: Obstetrics & Gynecology

## 2020-05-30 ENCOUNTER — Inpatient Hospital Stay (HOSPITAL_COMMUNITY): Payer: Medicaid Other

## 2020-05-30 ENCOUNTER — Encounter (HOSPITAL_COMMUNITY): Payer: Medicaid Other

## 2020-05-31 ENCOUNTER — Encounter: Payer: Self-pay | Admitting: General Practice

## 2020-06-02 ENCOUNTER — Other Ambulatory Visit: Payer: Self-pay

## 2020-06-02 ENCOUNTER — Telehealth: Payer: Self-pay | Admitting: General Practice

## 2020-06-02 ENCOUNTER — Ambulatory Visit (INDEPENDENT_AMBULATORY_CARE_PROVIDER_SITE_OTHER): Payer: Medicaid Other

## 2020-06-02 ENCOUNTER — Ambulatory Visit: Payer: Medicaid Other

## 2020-06-02 VITALS — BP 129/74 | HR 99 | Ht 63.0 in | Wt 187.4 lb

## 2020-06-02 DIAGNOSIS — Z4889 Encounter for other specified surgical aftercare: Secondary | ICD-10-CM

## 2020-06-02 NOTE — Progress Notes (Signed)
Pt here today for incision check . Pt is 1 week post-op C/S. Pt denies any draining, swelling, odor to incision. Incision dry, clean, intake by inspection today. Pt has steri-strips still applied. Pt advised to remove steri-strips with next shower. Pt agreeable and verbalized understanding.   Reviewed incision with Dr Vergie Living.   Pt has PP visit scheduled for 07/07/20 with Dr Germaine Pomfret. Pt verbalized understanding to date and time of appt.   Judeth Cornfield, RN  06/02/20

## 2020-06-02 NOTE — Telephone Encounter (Signed)
Patient called and left message on nurse voicemail stating she has an appt at 9am and cannot come because she has a flat tire. Called patient back and offered afternoon appt. Patient verbalized understanding & states she can come at 330. Explained visit was for incision/BP check. Patient verbalized understanding.

## 2020-06-06 DIAGNOSIS — Z98891 History of uterine scar from previous surgery: Secondary | ICD-10-CM

## 2020-06-09 NOTE — Progress Notes (Signed)
Patient was assessed and managed by nursing staff during this encounter. I have reviewed the chart and agree with the documentation and plan. I have also made any necessary editorial changes.  Blythedale Bing, MD 06/09/2020 9:49 AM

## 2020-06-10 ENCOUNTER — Other Ambulatory Visit: Payer: Self-pay

## 2020-06-10 ENCOUNTER — Ambulatory Visit (INDEPENDENT_AMBULATORY_CARE_PROVIDER_SITE_OTHER): Payer: Medicaid Other | Admitting: *Deleted

## 2020-06-10 ENCOUNTER — Encounter: Payer: Self-pay | Admitting: *Deleted

## 2020-06-10 ENCOUNTER — Other Ambulatory Visit (HOSPITAL_COMMUNITY)
Admission: RE | Admit: 2020-06-10 | Discharge: 2020-06-10 | Disposition: A | Discharge status: Home / Self Care | Payer: Medicaid Other | Source: Ambulatory Visit | Attending: Family Medicine | Admitting: Family Medicine

## 2020-06-10 VITALS — BP 114/76 | HR 88 | Ht 63.0 in | Wt 183.1 lb

## 2020-06-10 DIAGNOSIS — T8149XA Infection following a procedure, other surgical site, initial encounter: Secondary | ICD-10-CM

## 2020-06-10 DIAGNOSIS — N898 Other specified noninflammatory disorders of vagina: Secondary | ICD-10-CM | POA: Diagnosis not present

## 2020-06-10 MED ORDER — AMOXICILLIN-POT CLAVULANATE 875-125 MG PO TABS: 1.0000 | ORAL_TABLET | Freq: Two times a day (BID) | ORAL | 0 refills | Status: AC

## 2020-06-10 NOTE — Progress Notes (Signed)
Chart reviewed for nurse visit. Agree with plan of care.   Venora Maples, MD 06/10/20 12:22 PM

## 2020-06-10 NOTE — Progress Notes (Signed)
Pt reports vaginal itching since yesterday - denies discharge or odor. Self swab was obtained and pt will be notified of result via Mychart. She also states that she noticed bleeding from C/S incision. Incision was assessed and found to have light induration above and below incision @ Lt half of incision. There was a small amount of purulent drainage from tiny opening @ center if incision. One small area where skin edges not completely closed @ Lt side of incision was also observed. No drainage observed from this location. Dr. Crissie Reese examined incision. Wound culture obtained and Rx for Augmentin was e-prescribed. Pt was instructed for proper hygiene of affected area. She has post partum appt scheduled on 5/5.  Pt voiced understanding of all information given.

## 2020-06-13 ENCOUNTER — Other Ambulatory Visit: Payer: Self-pay | Admitting: Family Medicine

## 2020-06-13 LAB — CERVICOVAGINAL ANCILLARY ONLY
Bacterial Vaginitis (gardnerella): POSITIVE — AB
Candida Glabrata: NEGATIVE
Candida Vaginitis: NEGATIVE
Chlamydia: NEGATIVE
Comment: NEGATIVE
Comment: NEGATIVE
Comment: NEGATIVE
Comment: NEGATIVE
Comment: NEGATIVE
Comment: NORMAL
Neisseria Gonorrhea: NEGATIVE
Trichomonas: NEGATIVE

## 2020-06-13 MED ORDER — METRONIDAZOLE 500 MG PO TABS: 500.0000 mg | ORAL_TABLET | Freq: Two times a day (BID) | ORAL | 0 refills | Status: AC

## 2020-06-14 LAB — WOUND CULTURE

## 2020-07-04 DIAGNOSIS — F438 Other reactions to severe stress: Secondary | ICD-10-CM | POA: Diagnosis not present

## 2020-07-07 ENCOUNTER — Encounter: Payer: Self-pay | Admitting: Family Medicine

## 2020-07-07 ENCOUNTER — Other Ambulatory Visit: Payer: Self-pay

## 2020-07-07 ENCOUNTER — Ambulatory Visit (INDEPENDENT_AMBULATORY_CARE_PROVIDER_SITE_OTHER): Payer: Medicaid Other | Admitting: Family Medicine

## 2020-07-07 VITALS — BP 108/71 | HR 95 | Ht 63.0 in | Wt 176.0 lb

## 2020-07-07 DIAGNOSIS — O9081 Anemia of the puerperium: Secondary | ICD-10-CM | POA: Diagnosis not present

## 2020-07-07 LAB — CBC
Hematocrit: 38.3 % (ref 34.0–46.6)
Hemoglobin: 11.3 g/dL (ref 11.1–15.9)
MCH: 21.7 pg — ABNORMAL LOW (ref 26.6–33.0)
MCHC: 29.5 g/dL — ABNORMAL LOW (ref 31.5–35.7)
MCV: 74 fL — ABNORMAL LOW (ref 79–97)
Platelets: 282 10*3/uL (ref 150–450)
RBC: 5.21 x10E6/uL (ref 3.77–5.28)
RDW: 19.1 % — ABNORMAL HIGH (ref 11.7–15.4)
WBC: 8.2 10*3/uL (ref 3.4–10.8)

## 2020-07-07 NOTE — Progress Notes (Signed)
Post Partum Visit Note  Linda Holmes is a 30 y.o. G65P2002 female who presents for a postpartum visit. She is 6 weeks postpartum following a repeat cesarean section.  I have fully reviewed the prenatal and intrapartum course. The delivery was at 40.1 gestational weeks.  Anesthesia: spinal. Postpartum course has been uncomplicated. Baby is doing well. Baby is feeding by breast. Bleeding no bleeding. Bowel function is normal. Bladder function is normal. Patient is sexually active. Contraception method is rhythm method. Postpartum depression screening: negative.   The pregnancy intention screening data noted above was reviewed. Potential methods of contraception were discussed. The patient elected to proceed with No Method - Other Reasonelects for NFP, counseled on risks.    Edinburgh Postnatal Depression Scale - 07/07/20 1107      Edinburgh Postnatal Depression Scale:  In the Past 7 Days   I have been able to laugh and see the funny side of things. 0    I have looked forward with enjoyment to things. 0    I have blamed myself unnecessarily when things went wrong. 0    I have been anxious or worried for no good reason. 0    I have felt scared or panicky for no good reason. 0    Things have been getting on top of me. 2    I have been so unhappy that I have had difficulty sleeping. 0    I have felt sad or miserable. 0    I have been so unhappy that I have been crying. 0    The thought of harming myself has occurred to me. 0    Edinburgh Postnatal Depression Scale Total 2           Health Maintenance Due  Topic Date Due  . COVID-19 Vaccine (1) Never done  . PAP SMEAR-Modifier  06/17/2020    The following portions of the patient's history were reviewed and updated as appropriate: allergies, current medications, past family history, past medical history, past social history, past surgical history and problem list.  Review of Systems Pertinent items are noted in HPI.  Objective:   BP 108/71   Pulse 95   Ht 5\' 3"  (1.6 m)   Wt 176 lb (79.8 kg)   Breastfeeding Yes   BMI 31.18 kg/m    General:  alert, cooperative and no distress   Breasts:  not indicated  Lungs: normal respiratory effort  Heart:  regular rate  Abdomen: soft, nondistended   Wound well approximated incision, no erythema, drainage, induration  GU exam:  not indicated       Assessment:    Normal postpartum exam.   Plan:   Essential components of care per ACOG recommendations:  1.  Mood and well being: Patient with negative depression screening today. Reviewed local resources for support.  - Patient tobacco use? No.   - hx of drug use? No.    2. Infant care and feeding:  -Patient currently breastmilk feeding? Yes. Discussed returning to work and pumping.  -Social determinants of health (SDOH) reviewed in EPIC. No concerns  3. Sexuality, contraception and birth spacing - Patient does not want a pregnancy in the next year. - Reviewed forms of contraception in tiered fashion. Patient desired natural family planning (NFP) today.   - Discussed birth spacing of 18 months  4. Sleep and fatigue -Encouraged family/partner/community support of 4 hrs of uninterrupted sleep to help with mood and fatigue  5. Physical Recovery  - Discussed  patients delivery and complications. She describes her labor as good. - Patient had a C-section. - Patient has urinary incontinence? No. - Patient is safe to resume physical and sexual activity  6.  Health Maintenance - HM due items addressed Yes - Last pap smear No results found for: DIAGPAP Pap smear not done at today's visit. Due for repeat pap 04/2021.  -Breast Cancer screening indicated? No.   7. Chronic Disease/Pregnancy Condition follow up: Anemia, discharge hgb 7.7, patient has previously received IV iron and continues to take PO iron every other day. Will recheck cbc and advise. - PCP follow up, list provided  Alric Seton, MD Center for  [facility], [facility]

## 2020-07-07 NOTE — Patient Instructions (Signed)
AREA FAMILY PRACTICE PHYSICIANS  [place] ([address]) . [facility] o [address] [place], [place] [address] o [phone] o Mon-Fri 8:30-12:30, 1:30-5:00 o Accepting Medicaid . [facility] o [address], [place], [place] [address] o [phone] o Mon-Fri 8:00-5:30 . [facility] o [address] [place], [place] [address] o [phone] o Mon, Tue, Thur, Fri 8:30-5:00, Wed 10:00-7:00 (closed 1-2pm) o Accepting Medicaid . [facility] o [address], [place], [place]  [address] o Phone - [phone]   Fax - [phone]  [place] ([address]) . [facility] o [address] [place], [place] [address] o [phone] o Mon-Fri 8:00-5:00 . [facility]  o [address] [place], [place] [address] o [phone] o Mon-Fri 8:30-5:30, Sat (Oct.-Mar.) 9:00-1:00 o Accepting Medicaid  [place] ([address]) . [facility] o [address], [place], [place] [address] o [phone] o Mon-Fri 8:00-5:00  [place] ([address]) . [facility] o [address], [place], [place] [address] o [phone] o Mon-Fri 8:00-5:00 . [facility] o [address], [place], [place] [address] o [phone] o Mon-Fri 8:00-5:00 . [facility] o [address] [place], [place] [address] o [phone] o Mon-Fri 8:00-5:00 . [facility] o [address] [place] [address] o [phone] o Mon-Fri 7:30-5:30  [place] ([address] & [address]) . [facility] o [address] [place], [place] [address] o [phone] o Mon-Thur 8:00-6:00 o Accepting Medicaid . [facility] o [address] [place], [place] [address] o [phone] o Mon-Thur 7:30-7:30, Fri 7:30-4:30 o Accepting Medicaid . [facility] o [address], [place], [place]  [address] o [phone]   Fax - [phone]  [place] ([address] & [address]) . [facility] o [address] [place], [place] [address] o [phone] o Mon-Fri 7:00-5:00 . [facility] o [address], [place], [place] [address] o [phone] o Mon-Fri 8:00-5:00 o Accepting Medicaid . [facility] o [address], , [place] [address] o [phone] o Mon-Fri 8:00-5:00 o Accepting Medicaid  [place] ([address]) . [facility] [place] o [address] [place], [place] [address] o [phone] o Mon-Fri 8:00-5:00 . [facility] o [address], [place], [place] [address] o [phone] o Mon-Fri 8:00-5:00 o Accepting Medicaid . [facility] o [address], [place], [place] [address] o [phone] o Mon-Fri 8:00-5:30, Sat&Sun by appointment (phones open at 8:30) o Accepting Medicaid  [place] ([address]) . [facility] o [address] [place], [place] [address] o [phone] o Mon-Thur 8:00-7:00, Fri 8:00-5:00, Sat 8:00-12:00, Sun 9:00-12:00 o Accepting Medicaid . [facility] [place] o [address], [place], [place] [address] o [phone] o Mon-Thur 8:00-5:00 o Accepting Medicaid . [facility] o [address] [place],  [address] o [phone] o Mon-Fri 8:00-5:30, Sat (Oct.-Mar.) 9:00-1:00 o Accepting Medicaid  [place]  ([address]) .   [facility] o [address], [place], [place] [address] o [phone] o Mon-Fri 8:00-5:00 o Accepting Medicaid   [place] ([address]) . [facility] [place] o [address], [place], [place] [address] o [phone] o Mon-Fri 8:00-5:00 . [facility] [place] o [address], [place], [place] [address] o [phone] o Mon-Fri 8:00-5:00 . [facility] [place] o [address] [place] [address], [place], [place] [address] o [phone] o Mon-Fri 8:00-5:00 o After hours clinic ([address] [place], [place] [address]) [phone] Mon-Fri 5:00-8:00, Sat 12:00-6:00, Sun 10:00-4:00 o Accepting Medicaid . [facility] [place] o [address], [place], [place]  [address] o [phone]   Fax - [phone]  [place] ([address]) . [facility] [place] o [address], [place], [place] [address] o [phone] o Mon-Fri 8:00-5:00 . [facility] [place] ([facility] [place] o [address], [place], [place] [address] o [phone] o Mon-Thur 8:00-7:00, Fri 8:00-5:00, Sat 8:00-12:00    

## 2020-07-11 DIAGNOSIS — F438 Other reactions to severe stress: Secondary | ICD-10-CM | POA: Diagnosis not present

## 2020-07-18 DIAGNOSIS — F438 Other reactions to severe stress: Secondary | ICD-10-CM | POA: Diagnosis not present

## 2020-07-27 DIAGNOSIS — F438 Other reactions to severe stress: Secondary | ICD-10-CM | POA: Diagnosis not present

## 2020-08-01 DIAGNOSIS — F438 Other reactions to severe stress: Secondary | ICD-10-CM | POA: Diagnosis not present

## 2020-08-09 DIAGNOSIS — F438 Other reactions to severe stress: Secondary | ICD-10-CM | POA: Diagnosis not present

## 2020-08-23 DIAGNOSIS — F438 Other reactions to severe stress: Secondary | ICD-10-CM | POA: Diagnosis not present

## 2020-09-05 DIAGNOSIS — F438 Other reactions to severe stress: Secondary | ICD-10-CM | POA: Diagnosis not present

## 2020-09-15 DIAGNOSIS — F438 Other reactions to severe stress: Secondary | ICD-10-CM | POA: Diagnosis not present

## 2020-09-22 DIAGNOSIS — F438 Other reactions to severe stress: Secondary | ICD-10-CM | POA: Diagnosis not present

## 2020-09-29 DIAGNOSIS — F438 Other reactions to severe stress: Secondary | ICD-10-CM | POA: Diagnosis not present

## 2020-10-05 DIAGNOSIS — F438 Other reactions to severe stress: Secondary | ICD-10-CM | POA: Diagnosis not present

## 2020-10-17 DIAGNOSIS — F438 Other reactions to severe stress: Secondary | ICD-10-CM | POA: Diagnosis not present

## 2020-10-27 DIAGNOSIS — F438 Other reactions to severe stress: Secondary | ICD-10-CM | POA: Diagnosis not present

## 2020-11-04 DIAGNOSIS — F438 Other reactions to severe stress: Secondary | ICD-10-CM | POA: Diagnosis not present

## 2020-11-09 DIAGNOSIS — F438 Other reactions to severe stress: Secondary | ICD-10-CM | POA: Diagnosis not present

## 2020-12-06 DIAGNOSIS — F4389 Other reactions to severe stress: Secondary | ICD-10-CM | POA: Diagnosis not present

## 2020-12-20 DIAGNOSIS — F4389 Other reactions to severe stress: Secondary | ICD-10-CM | POA: Diagnosis not present

## 2020-12-29 DIAGNOSIS — F4389 Other reactions to severe stress: Secondary | ICD-10-CM | POA: Diagnosis not present

## 2021-01-05 DIAGNOSIS — F4389 Other reactions to severe stress: Secondary | ICD-10-CM | POA: Diagnosis not present

## 2021-01-17 DIAGNOSIS — F4389 Other reactions to severe stress: Secondary | ICD-10-CM | POA: Diagnosis not present

## 2021-01-20 ENCOUNTER — Emergency Department (HOSPITAL_BASED_OUTPATIENT_CLINIC_OR_DEPARTMENT_OTHER)
Admission: EM | Admit: 2021-01-20 | Discharge: 2021-01-20 | Disposition: A | Discharge status: Home / Self Care | Payer: Medicaid Other | Attending: Emergency Medicine | Admitting: Emergency Medicine

## 2021-01-20 ENCOUNTER — Emergency Department (HOSPITAL_BASED_OUTPATIENT_CLINIC_OR_DEPARTMENT_OTHER): Payer: Medicaid Other | Admitting: Radiology

## 2021-01-20 ENCOUNTER — Other Ambulatory Visit: Payer: Self-pay

## 2021-01-20 DIAGNOSIS — M545 Low back pain, unspecified: Secondary | ICD-10-CM | POA: Insufficient documentation

## 2021-01-20 DIAGNOSIS — M7918 Myalgia, other site: Secondary | ICD-10-CM

## 2021-01-20 DIAGNOSIS — Y92410 Unspecified street and highway as the place of occurrence of the external cause: Secondary | ICD-10-CM | POA: Insufficient documentation

## 2021-01-20 DIAGNOSIS — Z8616 Personal history of COVID-19: Secondary | ICD-10-CM | POA: Insufficient documentation

## 2021-01-20 DIAGNOSIS — R109 Unspecified abdominal pain: Secondary | ICD-10-CM | POA: Insufficient documentation

## 2021-01-20 DIAGNOSIS — Z041 Encounter for examination and observation following transport accident: Secondary | ICD-10-CM | POA: Diagnosis not present

## 2021-01-20 LAB — PREGNANCY, URINE: Preg Test, Ur: NEGATIVE

## 2021-01-20 NOTE — ED Notes (Signed)
Pt d/c home per MD order. Discharge summary reviewed, verbalizes understanding. Ambulatory off unit. No s/s of acute distress noted at discharge.  °
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
## 2021-01-20 NOTE — ED Notes (Signed)
Pt to xray

## 2021-01-20 NOTE — Discharge Instructions (Signed)

## 2021-01-23 ENCOUNTER — Telehealth: Payer: Self-pay

## 2021-01-23 NOTE — Telephone Encounter (Signed)
Transition Care Management Unsuccessful Follow-up Telephone Call  Date of discharge and from where:  01/20/2021 from [facility]  Attempts:  1st Attempt  Reason for unsuccessful TCM follow-up call:  Left voice message

## 2021-01-24 NOTE — Telephone Encounter (Signed)
Transition Care Management Unsuccessful Follow-up Telephone Call  Date of discharge and from where:  01/20/2021 from [facility]  Attempts:  2nd Attempt  Reason for unsuccessful TCM follow-up call:  Left voice message

## 2021-01-25 NOTE — Telephone Encounter (Signed)
Transition Care Management Unsuccessful Follow-up Telephone Call  Date of discharge and from where:  01/20/2021 from [facility]  Attempts:  3rd Attempt  Reason for unsuccessful TCM follow-up call:  Unable to reach patient

## 2021-02-09 DIAGNOSIS — F4389 Other reactions to severe stress: Secondary | ICD-10-CM | POA: Diagnosis not present

## 2021-03-18 ENCOUNTER — Ambulatory Visit
Admission: EM | Admit: 2021-03-18 | Discharge: 2021-03-18 | Disposition: A | Discharge status: Home / Self Care | Payer: Medicaid Other | Attending: Internal Medicine | Admitting: Internal Medicine

## 2021-03-18 ENCOUNTER — Other Ambulatory Visit: Payer: Self-pay

## 2021-03-18 ENCOUNTER — Encounter: Payer: Self-pay | Admitting: Emergency Medicine

## 2021-03-18 DIAGNOSIS — L732 Hidradenitis suppurativa: Secondary | ICD-10-CM | POA: Diagnosis not present

## 2021-03-18 DIAGNOSIS — Z113 Encounter for screening for infections with a predominantly sexual mode of transmission: Secondary | ICD-10-CM | POA: Diagnosis not present

## 2021-03-18 MED ORDER — CEPHALEXIN 500 MG PO CAPS: 500.0000 mg | ORAL_CAPSULE | Freq: Four times a day (QID) | ORAL | 0 refills | Status: DC

## 2021-03-18 NOTE — ED Provider Notes (Signed)
[name]    CSN: [id number] Arrival date & time: 03/18/21  [id number]      History   Chief Complaint Chief Complaint  Patient presents with   Abscess    HPI Linda Holmes is a 31 y.o. female.   Patient presents for concerns of multiple abscesses to bilateral axilla as well as bilateral groin that has been present for approximately 1.5 weeks.  She reports that she has history of similar abscesses to same areas.  Abscesses have been draining on their own.  She denies fevers, chills, body aches.  She is also requesting routine STD testing but denies any known exposure or symptoms.  She would also like HIV and syphilis blood work completed.   Abscess  Past Medical History:  Diagnosis Date   Anemia    Medical history non-contributory     Patient Active Problem List   Diagnosis Date Noted   Post term pregnancy at 4041 weeks gestation 05/24/2020   COVID-19 03/30/2020   Supervision of high risk pregnancy, antepartum 11/04/2019   Short interval between pregnancies affecting pregnancy, antepartum 11/04/2019   Obesity in pregnancy 11/04/2019   History of cesarean delivery 12/23/2018   Thalassemia trait 11/24/2018    Past Surgical History:  Procedure Laterality Date   CESAREAN SECTION N/A 11/24/2018   Procedure: CESAREAN SECTION;  Surgeon: Catalina Antiguaonstant, Peggy, MD;  [facility];  Service: Obstetrics;  Laterality: N/A;   CESAREAN SECTION N/A 05/24/2020   Procedure: CESAREAN SECTION;  Surgeon: Levie HeritageStinson, Jacob J, DO;  [facility]: [facility];  Service: Obstetrics;  Laterality: N/A;    OB History     Gravida  2   Para  2   Term  2   Preterm      AB      Living  2      SAB      IAB      Ectopic      Multiple  0   Live Births  2            Home Medications    Prior to Admission medications   Medication Sig Start Date End Date Taking? Authorizing Provider  cephALEXin (KEFLEX) 500 MG capsule Take 1 capsule (500 mg total) by mouth 4 (four) times  daily. 03/18/21  Yes Corbitt Cloke, Rolly SalterHaley E, FNP  ferrous sulfate 325 (65 FE) MG EC tablet Take 1 tablet (325 mg total) by mouth every other day. Once daily 05/26/20   Alric SetonFirestone, Alicia C, MD  Prenatal Vit-Fe Fumarate-FA (PREPLUS) 27-1 MG TABS Take 1 tablet by mouth daily. 03/16/20   Hermina StaggersErvin, Michael L, MD    Family History Family History  Problem Relation Age of Onset   Hyperlipidemia Mother    Bipolar disorder Sister    Asthma Daughter     Social History Social History   Tobacco Use   Smoking status: Never   Smokeless tobacco: Never  Vaping Use   Vaping Use: Never used  Substance Use Topics   Alcohol use: Not Currently    Comment: every weekend   Drug use: Never     Allergies   Patient has no known allergies.   Review of Systems Review of Systems Per HPI  Physical Exam Triage Vital Signs ED Triage Vitals [03/18/21 0951]  Enc Vitals Group     BP 135/83     Pulse Rate (!) 104     Resp 16     Temp 98.4 F (36.9 C)  Temp Source Oral     SpO2 95 %     Weight      Height      Head Circumference      Peak Flow      Pain Score 3     Pain Loc      Pain Edu?      Excl. in GC?    No data found.  Updated Vital Signs BP 135/83 (BP Location: Left Arm)    Pulse (!) 104    Temp 98.4 F (36.9 C) (Oral)    Resp 16    SpO2 95%   Visual Acuity Right Eye Distance:   Left Eye Distance:   Bilateral Distance:    Right Eye Near:   Left Eye Near:    Bilateral Near:     Physical Exam Exam conducted with a chaperone present.  Constitutional:      General: She is not in acute distress.    Appearance: Normal appearance. She is not toxic-appearing or diaphoretic.  HENT:     Head: Normocephalic and atraumatic.  Eyes:     Extraocular Movements: Extraocular movements intact.     Conjunctiva/sclera: Conjunctivae normal.  Cardiovascular:     Rate and Rhythm: Normal rate and regular rhythm.     Pulses: Normal pulses.     Heart sounds: Normal heart sounds.  Pulmonary:      Effort: Pulmonary effort is normal. No respiratory distress.     Breath sounds: Normal breath sounds.  Skin:    Findings: Abscess present.     Comments: Patient has multiple indurated abscesses present to bilateral axilla and bilateral groin/inner upper thigh area.  Some of the abscess are draining on their own at this time.  Neurological:     General: No focal deficit present.     Mental Status: She is alert and oriented to person, place, and time. Mental status is at baseline.  Psychiatric:        Mood and Affect: Mood normal.        Behavior: Behavior normal.        Thought Content: Thought content normal.        Judgment: Judgment normal.     UC Treatments / Results  Labs (all labs ordered are listed, but only abnormal results are displayed) Labs Reviewed  HIV ANTIBODY (ROUTINE TESTING W REFLEX)  RPR  CERVICOVAGINAL ANCILLARY ONLY    EKG   Radiology No results found.  Procedures Procedures (including critical care time)  Medications Ordered in UC Medications - No data to display  Initial Impression / Assessment and Plan / UC Course  I have reviewed the triage vital signs and the nursing notes.  Pertinent labs & imaging results that were available during my care of the patient were reviewed by me and considered in my medical decision making (see chart for details).     Physical exam is consistent with multiple abscesses present to bilateral axilla and groin.  This seems consistent with hidradenitis suppurativa.  Will prescribe cephalexin antibiotic as patient is currently breast-feeding and this is safe with breast-feeding.  Patient to use warm compresses to affected areas as well.  Patient will need follow-up with dermatology for further evaluation and management given severity and chronicity of issue.  Patient provided with contact information for dermatology.  Discussed warm compresses as well.  Routine STD testing completed per patient request and is pending.   Patient verbalized understanding and was agreeable with plan. Final Clinical Impressions(s) /  UC Diagnoses   Final diagnoses:  Hidradenitis suppurativa  Screening examination for venereal disease     Discharge Instructions      You have been prescribed an antibiotic to take for abscesses.  Please follow-up with provided contact information for dermatology for further evaluation and management.    ED Prescriptions     Medication Sig Dispense Auth. Provider   cephALEXin (KEFLEX) 500 MG capsule Take 1 capsule (500 mg total) by mouth 4 (four) times daily. 28 capsule [place], [place], [place]      PDMP not reviewed this encounter.   Gustavus Bryant, [place] 03/18/21 1041

## 2021-03-18 NOTE — ED Triage Notes (Signed)
Multiple abscesses in bilateral axilla, one on groin, on-going x 1.5 weeks

## 2021-03-18 NOTE — Discharge Instructions (Signed)
You have been prescribed an antibiotic to take for abscesses.  Please follow-up with provided contact information for dermatology for further evaluation and management.

## 2021-03-19 LAB — HIV ANTIBODY (ROUTINE TESTING W REFLEX): HIV Screen 4th Generation wRfx: NONREACTIVE

## 2021-03-19 LAB — RPR: RPR Ser Ql: NONREACTIVE

## 2021-03-20 LAB — CERVICOVAGINAL ANCILLARY ONLY
Bacterial Vaginitis (gardnerella): POSITIVE — AB
Candida Glabrata: NEGATIVE
Candida Vaginitis: NEGATIVE
Chlamydia: POSITIVE — AB
Comment: NEGATIVE
Comment: NEGATIVE
Comment: NEGATIVE
Comment: NEGATIVE
Comment: NEGATIVE
Comment: NORMAL
Neisseria Gonorrhea: NEGATIVE
Trichomonas: NEGATIVE

## 2021-03-21 ENCOUNTER — Telehealth (HOSPITAL_COMMUNITY): Payer: Self-pay

## 2021-03-21 MED ORDER — METRONIDAZOLE 0.75 % VA GEL: 1.0000 | Freq: Every day | VAGINAL | 0 refills | Status: AC

## 2021-03-21 MED ORDER — AZITHROMYCIN 250 MG PO TABS: 1000.0000 mg | ORAL_TABLET | Freq: Once | ORAL | 0 refills | Status: AC

## 2021-03-30 DIAGNOSIS — F411 Generalized anxiety disorder: Secondary | ICD-10-CM | POA: Diagnosis not present

## 2021-04-26 DIAGNOSIS — F411 Generalized anxiety disorder: Secondary | ICD-10-CM | POA: Diagnosis not present

## 2021-04-27 DIAGNOSIS — R42 Dizziness and giddiness: Secondary | ICD-10-CM | POA: Diagnosis not present

## 2021-05-11 DIAGNOSIS — F411 Generalized anxiety disorder: Secondary | ICD-10-CM | POA: Diagnosis not present

## 2021-05-15 ENCOUNTER — Ambulatory Visit (INDEPENDENT_AMBULATORY_CARE_PROVIDER_SITE_OTHER): Payer: Medicaid Other | Admitting: Primary Care

## 2021-05-15 ENCOUNTER — Other Ambulatory Visit: Payer: Self-pay

## 2021-05-15 ENCOUNTER — Encounter (INDEPENDENT_AMBULATORY_CARE_PROVIDER_SITE_OTHER): Payer: Self-pay | Admitting: Primary Care

## 2021-05-15 VITALS — BP 119/77 | HR 94 | Temp 97.7°F | Ht 63.0 in | Wt 210.6 lb

## 2021-05-15 DIAGNOSIS — Z23 Encounter for immunization: Secondary | ICD-10-CM | POA: Diagnosis not present

## 2021-05-15 DIAGNOSIS — Z131 Encounter for screening for diabetes mellitus: Secondary | ICD-10-CM

## 2021-05-15 DIAGNOSIS — Z7689 Persons encountering health services in other specified circumstances: Secondary | ICD-10-CM | POA: Diagnosis not present

## 2021-05-15 DIAGNOSIS — L732 Hidradenitis suppurativa: Secondary | ICD-10-CM | POA: Diagnosis not present

## 2021-05-15 LAB — POCT GLYCOSYLATED HEMOGLOBIN (HGB A1C): Hemoglobin A1C: 5.6 % (ref 4.0–5.6)

## 2021-05-15 NOTE — Progress Notes (Signed)
? ?New Patient Office Visit ? ?Subjective:  ?Patient ID: Linda Holmes, female    DOB: 1991/01/17  Age: 31 y.o. MRN: [id number] ? ?CC:  ?Chief Complaint  ?Patient presents with  ? New Patient (Initial Visit)  ?  Referral to dermatologist for HS  ? ? ?HPI ?[name] 30 year obese female presents for establishment of care , she is nursing her daughter age 73 months. Her major concern is tx for Hidradenitis. Encourage her to wait until her daughter stopped breastfeeding.  Patient has No headache, No chest pain, No abdominal pain - No Nausea, No new weakness tingling or numbness, No Cough - shortness of breath. ? ?Past Medical History:  ?Diagnosis Date  ? Anemia   ? Medical history non-contributory   ? ? ?Past Surgical History:  ?Procedure Laterality Date  ? CESAREAN SECTION N/A 11/24/2018  ? Procedure: CESAREAN SECTION;  Surgeon: Linda Antigua, MD;  Location: [facility];  Service: Obstetrics;  Laterality: N/A;  ? CESAREAN SECTION N/A 05/24/2020  ? Procedure: CESAREAN SECTION;  Surgeon: Linda Heritage, DO;  Location: [facility];  Service: Obstetrics;  Laterality: N/A;  ? ? ?Family History  ?Problem Relation Age of Onset  ? Hyperlipidemia Mother   ? Bipolar disorder Sister   ? Asthma Daughter   ? ? ?Social History  ? ?Socioeconomic History  ? Marital status: Single  ?  Spouse name: Not on file  ? Number of children: Not on file  ? Years of education: 42  ? Highest education level: Bachelor's degree (e.g., BA, AB, BS)  ?Occupational History  ? Not on file  ?Tobacco Use  ? Smoking status: Never  ? Smokeless tobacco: Never  ?Vaping Use  ? Vaping Use: Never used  ?Substance and Sexual Activity  ? Alcohol use: Not Currently  ?  Comment: every weekend  ? Drug use: Never  ? Sexual activity: Yes  ?  Birth control/protection: None  ?Other Topics Concern  ? Not on file  ?Social History Narrative  ? Not on file  ? ?Social Determinants of Health  ? ?Financial Resource Strain: Not on file  ?Food Insecurity: Not on file   ?Transportation Needs: Not on file  ?Physical Activity: Not on file  ?Stress: Not on file  ?Social Connections: Not on file  ?Intimate Partner Violence: Not on file  ? ? ?ROS ?Comprehensive ROS Pertinent positive and negative noted in HPI   ? ?Objective:  ? ?Today's Vitals: BP 119/77 (BP Location: Right Arm, Patient Position: Sitting, Cuff Size: Normal)   Pulse 94   Temp 97.7 ?F (36.5 ?C) (Oral)   Ht 5\' 3"  (1.6 m)   Wt 210 lb 9.6 oz (95.5 kg)   SpO2 95%   Breastfeeding Yes   BMI 37.31 kg/m?  ? ?Physical Exam ?Vitals reviewed.  ?Constitutional:   ?   Appearance: She is obese.  ?HENT:  ?   Head: Normocephalic.  ?   Right Ear: Tympanic membrane and external ear normal.  ?   Left Ear: Tympanic membrane and external ear normal.  ?   Nose: Nose normal.  ?Eyes:  ?   Extraocular Movements: Extraocular movements intact.  ?   Pupils: Pupils are equal, round, and reactive to light.  ?Cardiovascular:  ?   Rate and Rhythm: Normal rate and regular rhythm.  ?Pulmonary:  ?   Effort: Pulmonary effort is normal.  ?   Breath sounds: Normal breath sounds.  ?Abdominal:  ?   General: Abdomen  is flat. There is distension.  ?   Palpations: Abdomen is soft.  ?Musculoskeletal:     ?   General: Normal range of motion.  ?   Cervical back: Normal range of motion.  ?Skin: ?   General: Skin is warm and dry.  ?Neurological:  ?   Mental Status: She is alert and oriented to person, place, and time.  ?Psychiatric:     ?   Mood and Affect: Mood normal.     ?   Behavior: Behavior normal.     ?   Thought Content: Thought content normal.     ?   Judgment: Judgment normal.  ? ?Assessment & Plan:  ?Linda Holmes was seen today for new patient (initial visit). ? ?Diagnoses and all orders for this visit: ? ?Screening for diabetes mellitus ?-     HgB A1c 5.6 discuused dx for prediabetes 5.7-6.4 start monitoring carbs- rice , potatoes , grits, breads sweets and increase exercising  ? ?Hidradenitis ?Refer to dermatology after breast feeding ? ?Need for  immunization against influenza ?-     Flu Vaccine QUAD 73mo+IM (Fluarix, Fluzone & Alfiuria Quad PF) ? ? Encounter to establish care ?Establish care with new PCP ? ? ?Outpatient Encounter Medications as of 05/15/2021  ?Medication Sig  ? Prenatal Vit-Fe Fumarate-FA (PREPLUS) 27-1 MG TABS Take 1 tablet by mouth daily.  ? [DISCONTINUED] cephALEXin (KEFLEX) 500 MG capsule Take 1 capsule (500 mg total) by mouth 4 (four) times daily.  ? [DISCONTINUED] ferrous sulfate 325 (65 FE) MG EC tablet Take 1 tablet (325 mg total) by mouth every other day. Once daily  ? ?No facility-administered encounter medications on file as of 05/15/2021.  ? ? ?Follow-up: Return if symptoms worsen or fail to improve.  ? ?Linda Sessions, NP ? ?

## 2021-05-15 NOTE — Patient Instructions (Signed)
Influenza, Adult °Influenza is also called "the flu." It is an infection in the lungs, nose, and throat (respiratory tract). It spreads easily from person to person (is contagious). The flu causes symptoms that are like a cold, along with high fever and body aches. °What are the causes? °This condition is caused by the influenza virus. You can get the virus by: °Breathing in droplets that are in the air after a person infected with the flu coughed or sneezed. °Touching something that has the virus on it and then touching your mouth, nose, or eyes. °What increases the risk? °Certain things may make you more likely to get the flu. These include: °Not washing your hands often. °Having close contact with many people during cold and flu season. °Touching your mouth, eyes, or nose without first washing your hands. °Not getting a flu shot every year. °You may have a higher risk for the flu, and serious problems, such as a lung infection (pneumonia), if you: °Are older than 65. °Are pregnant. °Have a weakened disease-fighting system (immune system) because of a disease or because you are taking certain medicines. °Have a long-term (chronic) condition, such as: °Heart, kidney, or lung disease. °Diabetes. °Asthma. °Have a liver disorder. °Are very overweight (morbidly obese). °Have anemia. °What are the signs or symptoms? °Symptoms usually begin suddenly and last 4-14 days. They may include: °Fever and chills. °Headaches, body aches, or muscle aches. °Sore throat. °Cough. °Runny or stuffy (congested) nose. °Feeling discomfort in your chest. °Not wanting to eat as much as normal. °Feeling weak or tired. °Feeling dizzy. °Feeling sick to your stomach or throwing up. °How is this treated? °If the flu is found early, you can be treated with antiviral medicine. This can help to reduce how bad the illness is and how long it lasts. This may be given by mouth or through an IV tube. °Taking care of yourself at home can help your  symptoms get better. Your doctor may want you to: °Take over-the-counter medicines. °Drink plenty of fluids. °The flu often goes away on its own. If you have very bad symptoms or other problems, you may be treated in a hospital. °Follow these instructions at home: °  °Activity °Rest as needed. Get plenty of sleep. °Stay home from work or school as told by your doctor. °Do not leave home until you do not have a fever for 24 hours without taking medicine. °Leave home only to go to your doctor. °Eating and drinking °Take an ORS (oral rehydration solution). This is a drink that is sold at pharmacies and stores. °Drink enough fluid to keep your pee pale yellow. °Drink clear fluids in small amounts as you are able. Clear fluids include: °Water. °Ice chips. °Fruit juice mixed with water. °Low-calorie sports drinks. °Eat bland foods that are easy to digest. Eat small amounts as you are able. These foods include: °Bananas. °Applesauce. °Rice. °Lean meats. °Toast. °Crackers. °Do not eat or drink: °Fluids that have a lot of sugar or caffeine. °Alcohol. °Spicy or fatty foods. °General instructions °Take over-the-counter and prescription medicines only as told by your doctor. °Use a cool mist humidifier to add moisture to the air in your home. This can make it easier for you to breathe. °When using a cool mist humidifier, clean it daily. Empty water and replace with clean water. °Cover your mouth and nose when you cough or sneeze. °Wash your hands with soap and water often and for at least 20 seconds. This is also important after   you cough or sneeze. If you cannot use soap and water, use alcohol-based hand sanitizer. °Keep all follow-up visits. °How is this prevented? ° °Get a flu shot every year. You may get the flu shot in late summer, fall, or winter. Ask your doctor when you should get your flu shot. °Avoid contact with people who are sick during fall and winter. This is cold and flu season. °Contact a doctor if: °You get  new symptoms. °You have: °Chest pain. °Watery poop (diarrhea). °A fever. °Your cough gets worse. °You start to have more mucus. °You feel sick to your stomach. °You throw up. °Get help right away if you: °Have shortness of breath. °Have trouble breathing. °Have skin or nails that turn a bluish color. °Have very bad pain or stiffness in your neck. °Get a sudden headache. °Get sudden pain in your face or ear. °Cannot eat or drink without throwing up. °These symptoms may represent a serious problem that is an emergency. Get medical help right away. Call your local emergency services (911 in the [place] °Do not wait to see if the symptoms will go away. °Do not drive yourself to the hospital. °Summary °Influenza is also called "the flu." It is an infection in the lungs, nose, and throat. It spreads easily from person to person. °Take over-the-counter and prescription medicines only as told by your doctor. °Getting a flu shot every year is the best way to not get the flu. °This information is not intended to replace advice given to you by your health care provider. Make sure you discuss any questions you have with your health care provider. °Document Revised: 10/09/2019 Document Reviewed: 10/09/2019 °Elsevier Patient Education © 2022 [employer] ° °

## 2021-05-29 ENCOUNTER — Encounter (INDEPENDENT_AMBULATORY_CARE_PROVIDER_SITE_OTHER): Payer: Self-pay | Admitting: Primary Care

## 2021-05-31 ENCOUNTER — Other Ambulatory Visit (INDEPENDENT_AMBULATORY_CARE_PROVIDER_SITE_OTHER): Payer: Self-pay | Admitting: Primary Care

## 2021-05-31 MED ORDER — CLINDAMYCIN PHOS-BENZOYL PEROX 1-5 % EX GEL: Freq: Two times a day (BID) | CUTANEOUS | 2 refills | Status: DC

## 2021-06-23 ENCOUNTER — Ambulatory Visit (INDEPENDENT_AMBULATORY_CARE_PROVIDER_SITE_OTHER): Payer: Medicaid Other | Admitting: Primary Care

## 2021-06-30 ENCOUNTER — Encounter (INDEPENDENT_AMBULATORY_CARE_PROVIDER_SITE_OTHER): Payer: Self-pay | Admitting: Primary Care

## 2021-07-03 ENCOUNTER — Other Ambulatory Visit: Payer: Self-pay | Admitting: Nurse Practitioner

## 2021-07-03 DIAGNOSIS — L732 Hidradenitis suppurativa: Secondary | ICD-10-CM

## 2021-07-05 DIAGNOSIS — Z79899 Other long term (current) drug therapy: Secondary | ICD-10-CM | POA: Diagnosis not present

## 2021-07-05 DIAGNOSIS — L732 Hidradenitis suppurativa: Secondary | ICD-10-CM | POA: Diagnosis not present

## 2021-07-05 DIAGNOSIS — N912 Amenorrhea, unspecified: Secondary | ICD-10-CM | POA: Diagnosis not present

## 2021-07-06 DIAGNOSIS — L732 Hidradenitis suppurativa: Secondary | ICD-10-CM

## 2021-07-06 HISTORY — DX: Hidradenitis suppurativa: L73.2

## 2021-07-27 DIAGNOSIS — F411 Generalized anxiety disorder: Secondary | ICD-10-CM | POA: Diagnosis not present

## 2021-08-10 DIAGNOSIS — F411 Generalized anxiety disorder: Secondary | ICD-10-CM | POA: Diagnosis not present

## 2021-08-31 DIAGNOSIS — F411 Generalized anxiety disorder: Secondary | ICD-10-CM | POA: Diagnosis not present

## 2021-09-08 ENCOUNTER — Other Ambulatory Visit (HOSPITAL_COMMUNITY)
Admission: RE | Admit: 2021-09-08 | Discharge: 2021-09-08 | Disposition: A | Discharge status: Home / Self Care | Payer: Medicaid Other | Source: Ambulatory Visit | Attending: Obstetrics and Gynecology | Admitting: Obstetrics and Gynecology

## 2021-09-08 ENCOUNTER — Other Ambulatory Visit: Payer: Self-pay

## 2021-09-08 ENCOUNTER — Encounter: Payer: Self-pay | Admitting: Obstetrics and Gynecology

## 2021-09-08 ENCOUNTER — Ambulatory Visit: Payer: Medicaid Other | Admitting: Obstetrics and Gynecology

## 2021-09-08 VITALS — BP 108/73 | HR 89 | Wt 189.2 lb

## 2021-09-08 DIAGNOSIS — Z01419 Encounter for gynecological examination (general) (routine) without abnormal findings: Secondary | ICD-10-CM | POA: Diagnosis not present

## 2021-09-08 DIAGNOSIS — Z124 Encounter for screening for malignant neoplasm of cervix: Secondary | ICD-10-CM | POA: Diagnosis not present

## 2021-09-08 DIAGNOSIS — Z113 Encounter for screening for infections with a predominantly sexual mode of transmission: Secondary | ICD-10-CM

## 2021-09-08 DIAGNOSIS — N911 Secondary amenorrhea: Secondary | ICD-10-CM | POA: Insufficient documentation

## 2021-09-08 MED ORDER — MEDROXYPROGESTERONE ACETATE 10 MG PO TABS: 10.0000 mg | ORAL_TABLET | Freq: Every day | ORAL | 2 refills | Status: DC

## 2021-09-08 NOTE — Progress Notes (Signed)
GYNECOLOGY ANNUAL PREVENTATIVE CARE ENCOUNTER NOTE  History:     Linda Holmes is a 31 y.o. [phone] female here for a routine annual gynecologic exam.  Current complaints: amenorrhea.   Denies abnormal vaginal bleeding, discharge, pelvic pain, problems with intercourse or other gynecologic concerns. Pt notes amenorrhea since her last delivery in 2021.  She has been exclusively breast feeding her child until very recently.  Pt also notes she had prolonged amenorrhea for 8-9 months with her first child.  Per pt UPT at home was negative.   Gynecologic History Patient's last menstrual period was 08/04/2019 (approximate). Contraception: none Last Pap: 2021.  Obstetric History OB History  Gravida Para Term Preterm AB Living  2 2 2     2   SAB IAB Ectopic Multiple Live Births        0 2    # Outcome Date GA Lbr Len/2nd Weight Sex Delivery Anes PTL Lv  2 Term 05/24/20 [redacted]w[redacted]d  6 lb 12.6 oz (3.08 kg) F CS-LTranv EPI  LIV  1 Term 11/24/18 [redacted]w[redacted]d  5 lb 15.6 oz (2.71 kg) M CS-LTranv EPI  LIV     Birth Comments: c/s non reactive nst    Past Medical History:  Diagnosis Date   Anemia    Medical history non-contributory     Past Surgical History:  Procedure Laterality Date   CESAREAN SECTION N/A 11/24/2018   Procedure: CESAREAN SECTION;  Surgeon: 11/26/2018, MD;  Location: [facility];  Service: Obstetrics;  Laterality: N/A;   CESAREAN SECTION N/A 05/24/2020   Procedure: CESAREAN SECTION;  Surgeon: 05/26/2020, DO;  Location: [facility];  Service: Obstetrics;  Laterality: N/A;    Current Outpatient Medications on File Prior to Visit  Medication Sig Dispense Refill   clindamycin-benzoyl peroxide (BENZACLIN) gel Apply topically 2 (two) times daily. Apply to affective areas twice a day 50 g 2   Prenatal Vit-Fe Fumarate-FA (PREPLUS) 27-1 MG TABS Take 1 tablet by mouth daily. 30 tablet 6   spironolactone (ALDACTONE) 50 MG tablet Take 1 tablet by mouth daily.     spironolactone (ALDACTONE)  50 MG tablet Take 50 mg by mouth daily.     No current facility-administered medications on file prior to visit.    No Known Allergies  Social History:  reports that she has never smoked. She has never used smokeless tobacco. She reports that she does not currently use alcohol. She reports that she does not use drugs.  Family History  Problem Relation Age of Onset   Hyperlipidemia Mother    Bipolar disorder Sister    Asthma Daughter     The following portions of the patient's history were reviewed and updated as appropriate: allergies, current medications, past family history, past medical history, past social history, past surgical history and problem list.  Review of Systems Pertinent items noted in HPI and remainder of comprehensive ROS otherwise negative.  Physical Exam:  BP 108/73   Pulse 89   Wt 189 lb 3.2 oz (85.8 kg)   LMP 08/04/2019 (Approximate)   Breastfeeding Yes   BMI 33.52 kg/m  CONSTITUTIONAL: Well-developed, well-nourished female in no acute distress.  HENT:  Normocephalic, atraumatic, External right and left ear normal. Oropharynx is clear and moist EYES: Conjunctivae and EOM are normal.  NECK: Normal range of motion, supple, no masses.  Normal thyroid.  SKIN: Skin is warm and dry. No rash noted. Not diaphoretic. No erythema. No pallor. MUSCULOSKELETAL: Normal range of motion. No  tenderness.  No cyanosis, clubbing, or edema.  2+ distal pulses. NEUROLOGIC: Alert and oriented to person, place, and time. Normal reflexes, muscle tone coordination.  PSYCHIATRIC: Normal mood and affect. Normal behavior. Normal judgment and thought content. CARDIOVASCULAR: Normal heart rate noted, regular rhythm RESPIRATORY: Clear to auscultation bilaterally. Effort and breath sounds normal, no problems with respiration noted. BREASTS: deferred ABDOMEN: Soft, no distention noted.  No tenderness, rebound or guarding.  PELVIC: Normal appearing external genitalia and urethral meatus;  normal appearing vaginal mucosa and cervix.  No abnormal discharge noted.  Pap smear obtained.  Normal uterine size, no other palpable masses, no uterine or adnexal tenderness.  Performed in the presence of a chaperone.  Narrow vagina noted.   Assessment and Plan:    1. Pap smear for cervical cancer screening   2. Screen for STD (sexually transmitted disease) Per pt request  3. Women's annual routine gynecological examination Normal annual exam  4. Routine screening for STI (sexually transmitted infection)   5. Amenorrhea, secondary Possible amenorrhea secondary to breast feeding.  Will check TSH, FSH and estradiol. Progesterone challenge with provera  Discuss results in 6 weeks  Will follow up results of pap smear and manage accordingly. Mammogram scheduled Routine preventative health maintenance measures emphasized. Please refer to After Visit Summary for other counseling recommendations.      Mariel Aloe, MD, FACOG Obstetrician & Gynecologist, [facility] for [facility], [facility]

## 2021-09-09 LAB — THYROID PANEL WITH TSH
Free Thyroxine Index: 1.9 (ref 1.2–4.9)
T3 Uptake Ratio: 26 % (ref 24–39)
T4, Total: 7.4 ug/dL (ref 4.5–12.0)
TSH: 1.61 u[IU]/mL (ref 0.450–4.500)

## 2021-09-09 LAB — HIV ANTIBODY (ROUTINE TESTING W REFLEX): HIV Screen 4th Generation wRfx: NONREACTIVE

## 2021-09-09 LAB — HEPATITIS B SURFACE ANTIGEN: Hepatitis B Surface Ag: NEGATIVE

## 2021-09-09 LAB — HEPATITIS C ANTIBODY: Hep C Virus Ab: NONREACTIVE

## 2021-09-09 LAB — ESTRADIOL: Estradiol: 12.4 pg/mL

## 2021-09-09 LAB — FOLLICLE STIMULATING HORMONE: FSH: 7 m[IU]/mL

## 2021-09-11 ENCOUNTER — Encounter: Payer: Self-pay | Admitting: Obstetrics and Gynecology

## 2021-09-11 LAB — CERVICOVAGINAL ANCILLARY ONLY
Bacterial Vaginitis (gardnerella): POSITIVE — AB
Candida Glabrata: NEGATIVE
Candida Vaginitis: NEGATIVE
Chlamydia: POSITIVE — AB
Comment: NEGATIVE
Comment: NEGATIVE
Comment: NEGATIVE
Comment: NEGATIVE
Comment: NEGATIVE
Comment: NORMAL
Neisseria Gonorrhea: NEGATIVE
Trichomonas: NEGATIVE

## 2021-09-12 ENCOUNTER — Telehealth: Payer: Self-pay | Admitting: Lactation Services

## 2021-09-12 DIAGNOSIS — F411 Generalized anxiety disorder: Secondary | ICD-10-CM | POA: Diagnosis not present

## 2021-09-12 LAB — CYTOLOGY - PAP
Chlamydia: NEGATIVE
Comment: NEGATIVE
Comment: NEGATIVE
Comment: NORMAL
Diagnosis: NEGATIVE
High risk HPV: NEGATIVE
Neisseria Gonorrhea: NEGATIVE

## 2021-09-12 MED ORDER — DOXYCYCLINE HYCLATE 100 MG PO TABS: 100.0000 mg | ORAL_TABLET | Freq: Two times a day (BID) | ORAL | 0 refills | Status: DC

## 2021-09-12 MED ORDER — METRONIDAZOLE 500 MG PO TABS: 500.0000 mg | ORAL_TABLET | Freq: Two times a day (BID) | ORAL | 0 refills | Status: DC

## 2021-09-12 NOTE — Telephone Encounter (Signed)
Called patient with results + for Chlamydia from recent vaginal swab. She is also + for BV.   Patient positive for both and treated in January 2023 for same infections, she reports her partner was not treated. Reviewed with her it it very important that partner call the [facility] or PCP for treatment or she will keep getting it. Reviewed we recommend no intercourse for 2 weeks until both partners are treated.   Spoke with Cleone Slim, CNM and she reports patient needs Doxycycline and Flagyl for treatment for both. Reviewed with patient that ATB will be sent to Pharmacy for her to pick up today. Reviewed it is recommended she follow up for TOC in 3 months.

## 2021-09-12 NOTE — Telephone Encounter (Signed)
STD report faxed to [facility]. Fax confirmation received.  

## 2021-09-19 DIAGNOSIS — F411 Generalized anxiety disorder: Secondary | ICD-10-CM | POA: Diagnosis not present

## 2021-10-04 ENCOUNTER — Encounter: Payer: Self-pay | Admitting: Obstetrics and Gynecology

## 2021-10-05 DIAGNOSIS — F411 Generalized anxiety disorder: Secondary | ICD-10-CM | POA: Diagnosis not present

## 2021-10-19 DIAGNOSIS — F411 Generalized anxiety disorder: Secondary | ICD-10-CM | POA: Diagnosis not present

## 2021-10-20 ENCOUNTER — Encounter: Payer: Self-pay | Admitting: Obstetrics and Gynecology

## 2021-10-20 ENCOUNTER — Ambulatory Visit (INDEPENDENT_AMBULATORY_CARE_PROVIDER_SITE_OTHER): Payer: Medicaid Other | Admitting: Obstetrics and Gynecology

## 2021-10-20 ENCOUNTER — Other Ambulatory Visit: Payer: Self-pay

## 2021-10-20 VITALS — BP 127/77 | HR 102 | Wt 192.6 lb

## 2021-10-20 DIAGNOSIS — N911 Secondary amenorrhea: Secondary | ICD-10-CM | POA: Diagnosis not present

## 2021-10-20 LAB — POCT PREGNANCY, URINE: Preg Test, Ur: NEGATIVE

## 2021-10-20 NOTE — Progress Notes (Signed)
Day 8 of provera light spotting, no bleeding or issues since

## 2021-10-20 NOTE — Progress Notes (Signed)
  CC: amenorrhea Subjective:    Patient ID: MARCEL SORTER, female    DOB: 03-20-1990, 31 y.o.   MRN: [id number]  HPI Pt seen for follow up.  She did have some light spotting  8 days s/p finishing her provera.  The patient continues with intermittent breast feeding.  No other issues.  Review of Systems     Objective:   Physical Exam Vitals:   10/20/21 0915  BP: 127/77  Pulse: (!) 102         Assessment & Plan:   1. Amenorrhea, secondary Since patient is still breast feeding, will pursue expectant management.  Once she has concluded breast feeding either continue to watch or have trial of OCP for cycle control.  UPT today was negative  I spent 10 minutes dedicated to the care of this patient including previsit review of records, face to face time with the patient discussing results, treatment options and post visit testing.   Warden Fillers, MD Faculty Attending, [facility]

## 2021-11-01 DIAGNOSIS — F411 Generalized anxiety disorder: Secondary | ICD-10-CM | POA: Diagnosis not present

## 2021-11-08 DIAGNOSIS — F411 Generalized anxiety disorder: Secondary | ICD-10-CM | POA: Diagnosis not present

## 2021-11-16 DIAGNOSIS — F411 Generalized anxiety disorder: Secondary | ICD-10-CM | POA: Diagnosis not present

## 2021-11-24 ENCOUNTER — Other Ambulatory Visit (HOSPITAL_COMMUNITY)
Admission: RE | Admit: 2021-11-24 | Discharge: 2021-11-24 | Disposition: A | Discharge status: Home / Self Care | Payer: Medicaid Other | Source: Ambulatory Visit | Attending: Obstetrics and Gynecology | Admitting: Obstetrics and Gynecology

## 2021-11-24 ENCOUNTER — Other Ambulatory Visit: Payer: Self-pay

## 2021-11-24 ENCOUNTER — Ambulatory Visit (INDEPENDENT_AMBULATORY_CARE_PROVIDER_SITE_OTHER): Payer: Medicaid Other | Admitting: Obstetrics and Gynecology

## 2021-11-24 ENCOUNTER — Encounter: Payer: Self-pay | Admitting: Obstetrics and Gynecology

## 2021-11-24 DIAGNOSIS — Z8619 Personal history of other infectious and parasitic diseases: Secondary | ICD-10-CM | POA: Diagnosis not present

## 2021-11-24 NOTE — Progress Notes (Signed)
Pt presents for TOC. Tested positive for chlamydia and BV 09-08-21. Pt completed treatment and denies symptoms and further exposure.

## 2021-11-24 NOTE — Progress Notes (Signed)
Pt converted to nurse visit for self swab.  Lynnda Shields, MD

## 2021-11-26 LAB — CERVICOVAGINAL ANCILLARY ONLY
Bacterial Vaginitis (gardnerella): NEGATIVE
Candida Glabrata: NEGATIVE
Candida Vaginitis: NEGATIVE
Chlamydia: NEGATIVE
Comment: NEGATIVE
Comment: NEGATIVE
Comment: NEGATIVE
Comment: NEGATIVE
Comment: NEGATIVE
Comment: NORMAL
Neisseria Gonorrhea: NEGATIVE
Trichomonas: NEGATIVE

## 2021-11-30 DIAGNOSIS — F411 Generalized anxiety disorder: Secondary | ICD-10-CM | POA: Diagnosis not present

## 2021-12-26 ENCOUNTER — Ambulatory Visit
Admission: EM | Admit: 2021-12-26 | Discharge: 2021-12-26 | Disposition: A | Discharge status: Home / Self Care | Payer: Medicaid Other | Attending: Emergency Medicine | Admitting: Emergency Medicine

## 2021-12-26 ENCOUNTER — Encounter: Payer: Self-pay | Admitting: Emergency Medicine

## 2021-12-26 ENCOUNTER — Other Ambulatory Visit: Payer: Self-pay

## 2021-12-26 DIAGNOSIS — L732 Hidradenitis suppurativa: Secondary | ICD-10-CM | POA: Diagnosis not present

## 2021-12-26 DIAGNOSIS — Z113 Encounter for screening for infections with a predominantly sexual mode of transmission: Secondary | ICD-10-CM | POA: Diagnosis not present

## 2021-12-26 DIAGNOSIS — B3731 Acute candidiasis of vulva and vagina: Secondary | ICD-10-CM

## 2021-12-26 LAB — POCT URINE PREGNANCY: Preg Test, Ur: NEGATIVE

## 2021-12-26 MED ORDER — FLUCONAZOLE 150 MG PO TABS
150.0000 mg | ORAL_TABLET | Freq: Once | ORAL | 1 refills | Status: AC
Start: 2021-12-26 — End: 2021-12-26

## 2021-12-26 NOTE — Discharge Instructions (Addendum)
Urine pregnancy is negative.  When the vaginal itching and the discharge I noted on exam, I suspect that you may have a yeast infection.  I am treating this with Diflucan.  We are checking for HIV, syphilis, gonorrhea, chlamydia, trichomonas, BV, yeast, and HSV.  Take the medication as written. Give [place] a working phone number so that we can contact you if needed. Refrain from sexual contact until all of your labs have come back, symptoms have resolved, and your partner(s) are treated if necessary.   Go to www.goodrx.com  or www.costplusdrugs.com to look up your medications. This will give you a list of where you can find your prescriptions at the most affordable prices. Or ask the pharmacist what the cash price is, or if they have any other discount programs available to help make your medication more affordable. This can be less expensive than what you would pay with insurance.

## 2021-12-26 NOTE — ED Triage Notes (Signed)
[facility] with [facility] of recent exposure to herpes. Pt is requesting full  sti ttesting.

## 2021-12-26 NOTE — ED Provider Notes (Signed)
HPI  SUBJECTIVE:  Linda Holmes is a 31 y.o. female who presents for STD testing.  She states that her female partner has another female partner, who got an anonymous text stating that she may have been exposed to herpes.  Patient states that this female is asymptomatic to her knowledge.  Patient has no other sexual partners.  Patient reports vaginal itching.  She also reports a painful mass on her left labia starting last week.  He applied warm compresses, and it popped and drained, with improvement in the pain.  It is now resolving.  She is not sure if this was blisters or a flare of her hidradenitis suppurativa.  No vaginal odor, bleeding, discharge, fevers, abdominal, back, pelvic pain, urinary complaints.    Patient has a past medical history of chlamydia, BV.  She was treated for chlamydia and BV on 7/7.  She had repeat testing through her OB/GYN on 9/22, and gonorrhea, chlamydia were negative.  She also has a history of gonorrhea, yeast vaginitis, hidradenitis suppurativa.  No history of HSV, HIV, syphilis, trichomonas, diabetes, MRSA.  LMP: 2021.  She is currently breast-feeding.  Would like to be checked for pregnancy.  PCP: [facility].  Past Medical History:  Diagnosis Date   Anemia    Medical history non-contributory     Past Surgical History:  Procedure Laterality Date   CESAREAN SECTION N/A 11/24/2018   Procedure: CESAREAN SECTION;  Surgeon: Catalina Antigua, MD;  Location: [facility];  Service: Obstetrics;  Laterality: N/A;   CESAREAN SECTION N/A 05/24/2020   Procedure: CESAREAN SECTION;  Surgeon: Levie Heritage, DO;  Location: [facility];  Service: Obstetrics;  Laterality: N/A;    Family History  Problem Relation Age of Onset   Hyperlipidemia Mother    Bipolar disorder Sister    Asthma Daughter     Social History   Tobacco Use   Smoking status: Never   Smokeless tobacco: Never  Vaping Use   Vaping Use: Never used  Substance Use Topics   Alcohol use: Not  Currently    Comment: every weekend   Drug use: Never    No current facility-administered medications for this encounter.  Current Outpatient Medications:    fluconazole (DIFLUCAN) 150 MG tablet, Take 1 tablet (150 mg total) by mouth once for 1 dose. 1 tab po x 1. May repeat in 72 hours if no improvement, Disp: 2 tablet, Rfl: 1   clindamycin-benzoyl peroxide (BENZACLIN) gel, Apply topically 2 (two) times daily. Apply to affective areas twice a day (Patient not taking: Reported on 10/20/2021), Disp: 50 g, Rfl: 2   medroxyPROGESTERone (PROVERA) 10 MG tablet, Take 1 tablet (10 mg total) by mouth daily. Use for ten days (Patient not taking: Reported on 10/20/2021), Disp: 10 tablet, Rfl: 2   Prenatal Vit-Fe Fumarate-FA (PREPLUS) 27-1 MG TABS, Take 1 tablet by mouth daily., Disp: 30 tablet, Rfl: 6   spironolactone (ALDACTONE) 50 MG tablet, Take 1 tablet by mouth daily., Disp: , Rfl:    spironolactone (ALDACTONE) 50 MG tablet, Take 50 mg by mouth daily., Disp: , Rfl:   No Known Allergies   ROS  As noted in HPI.   Physical Exam  BP 106/70   Pulse 90   Temp 98.1 F (36.7 C) (Oral)   Resp 18   SpO2 98%   Breastfeeding Yes   Constitutional: Well developed, well nourished, no acute distress Eyes:  EOMI, conjunctiva normal bilaterally HENT: Normocephalic, atraumatic,mucus membranes moist Respiratory: Normal  inspiratory effort Cardiovascular: Normal rate GI: nondistended GU: Healed scars consistent with at bedtime.  1 single nontender lesion that appears to be a skin fissure versus excoriation.  No vesicular rash.  No vesicular rash noted on speculum exam.  Thick, white nonodorous vaginal discharge.  No pain with speculum exam. skin: No rash, skin intact Musculoskeletal: no deformities Neurologic: Alert & oriented x 3, no focal neuro deficits Psychiatric: Speech and behavior appropriate   ED Course   Medications - No data to display  Orders Placed This Encounter  Procedures    Pelvic exam    Standing Status:   Standing    Number of Occurrences:   1   RPR    Standing Status:   Standing    Number of Occurrences:   1   HIV Antibody (routine testing w rflx)    Standing Status:   Standing    Number of Occurrences:   1   POCT urine pregnancy    Standing Status:   Standing    Number of Occurrences:   1    Results for orders placed or performed during the hospital encounter of 12/26/21 (from the past 24 hour(s))  POCT urine pregnancy     Status: None   Collection Time: 12/26/21  9:45 AM  Result Value Ref Range   Preg Test, Ur Negative Negative   No results found.  ED Clinical Impression  1. Screening examination for STD (sexually transmitted disease)   2. Hidradenitis suppurativa   3. Yeast vaginitis      ED Assessment/Plan     Previous labs reviewed.  As noted in HPI.  1.  STD testing.  Sent off gonorrhea, chlamydia, trichomonas, BV and yeast.  Also checking HIV, RPR.  She does have an area that I will test for herpes, however, there is no overt evidence of herpes today.  Will defer treatment until labs are resulted.  Urine pregnancy is negative.  She does have some vaginal discharge combined with itching, so we will treat empirically for yeast vaginitis.  Advised patient to refrain from intercourse until all labs are resulted, partners are treated if necessary, and her symptoms have resolved.  2.  Vaginal itching, discharge.  Suspect yeast vaginitis Diflucan 150 mg p.o. x1, may repeat in 72 hours if still symptomatic  3.  Labial mass: Appears to be hidradenitis suppurativa. It is resolving without intervention.  Continue warm compresses, spironolactone, topical cream as needed.  Follow-up with PCP as needed.  Discussed labs, MDM, treatment plan, and plan for follow-up with patient. Discussed sn/sx that should prompt return to the ED. patient agrees with plan.   Meds ordered this encounter  Medications   fluconazole (DIFLUCAN) 150 MG tablet    Sig:  Take 1 tablet (150 mg total) by mouth once for 1 dose. 1 tab po x 1. May repeat in 72 hours if no improvement    Dispense:  2 tablet    Refill:  1      *This clinic note was created using [employer]. Therefore, there may be occasional mistakes despite careful proofreading.  ?    Melynda Ripple, MD 12/26/21 [phone]

## 2021-12-27 LAB — RPR: RPR Ser Ql: NONREACTIVE

## 2021-12-27 LAB — HIV ANTIBODY (ROUTINE TESTING W REFLEX): HIV Screen 4th Generation wRfx: NONREACTIVE

## 2022-01-02 ENCOUNTER — Encounter (INDEPENDENT_AMBULATORY_CARE_PROVIDER_SITE_OTHER): Payer: Self-pay | Admitting: Primary Care

## 2022-01-02 DIAGNOSIS — Z79899 Other long term (current) drug therapy: Secondary | ICD-10-CM | POA: Diagnosis not present

## 2022-01-02 DIAGNOSIS — L732 Hidradenitis suppurativa: Secondary | ICD-10-CM | POA: Diagnosis not present

## 2022-01-03 ENCOUNTER — Telehealth (HOSPITAL_COMMUNITY): Payer: Self-pay | Admitting: Emergency Medicine

## 2022-01-03 NOTE — Telephone Encounter (Signed)
Patient called looking for the results of her cyto swab from 10/24.  Called lab to follow up, they state they never received the sample.  Updated patient and informed her she could come back for a recollect.  She will come to the [place] location for a cyto recollect nurse visit.

## 2022-01-04 ENCOUNTER — Ambulatory Visit (HOSPITAL_COMMUNITY)
Admission: RE | Admit: 2022-01-04 | Discharge: 2022-01-04 | Disposition: A | Discharge status: Home / Self Care | Payer: Medicaid Other | Source: Ambulatory Visit | Attending: Internal Medicine | Admitting: Internal Medicine

## 2022-01-04 DIAGNOSIS — Z8619 Personal history of other infectious and parasitic diseases: Secondary | ICD-10-CM | POA: Diagnosis not present

## 2022-01-04 NOTE — ED Triage Notes (Signed)
Patient presents for recollect of cyto swab due to lost sample

## 2022-01-05 LAB — CERVICOVAGINAL ANCILLARY ONLY
Bacterial Vaginitis (gardnerella): NEGATIVE
Candida Glabrata: NEGATIVE
Candida Vaginitis: NEGATIVE
Chlamydia: NEGATIVE
Comment: NEGATIVE
Comment: NEGATIVE
Comment: NEGATIVE
Comment: NEGATIVE
Comment: NEGATIVE
Comment: NORMAL
Neisseria Gonorrhea: NEGATIVE
Trichomonas: NEGATIVE

## 2022-01-15 DIAGNOSIS — F411 Generalized anxiety disorder: Secondary | ICD-10-CM | POA: Diagnosis not present

## 2022-01-20 LAB — HERPES SIMPLEX VIRUS CULTURE

## 2022-02-01 DIAGNOSIS — F411 Generalized anxiety disorder: Secondary | ICD-10-CM | POA: Diagnosis not present

## 2022-02-12 DIAGNOSIS — F411 Generalized anxiety disorder: Secondary | ICD-10-CM | POA: Diagnosis not present

## 2022-04-12 IMAGING — US US OB COMP LESS 14 WK
1 series · 15 of 28 positions shown · non-contrast
Comparison: None.

CLINICAL DATA: Uncertain dating, G2P1

EXAM:
OBSTETRIC <14 WK ULTRASOUND
TECHNIQUE: Transabdominal ultrasound was performed for evaluation of the
gestation as well as the maternal uterus and adnexal regions.

[Series 1: us ob comp less 14 wk · 15 of 82 slices shown]
[im 1/82]
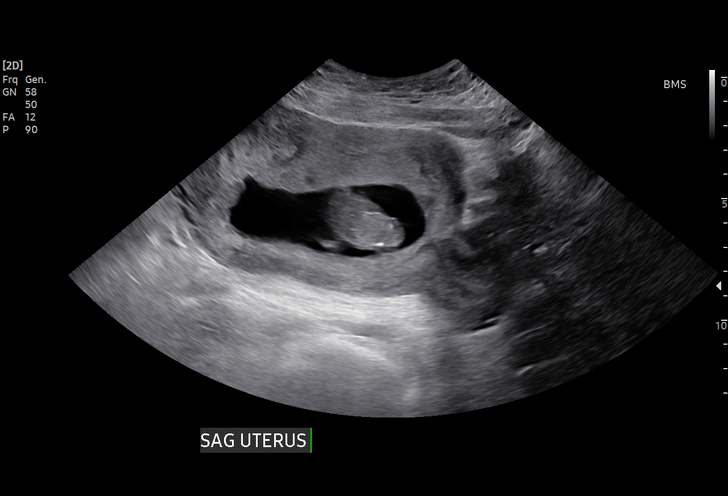
[im 7/82]
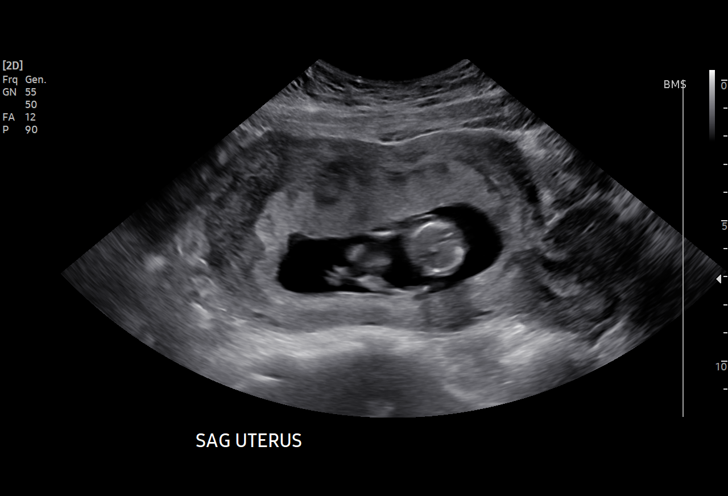
[im 13/82]
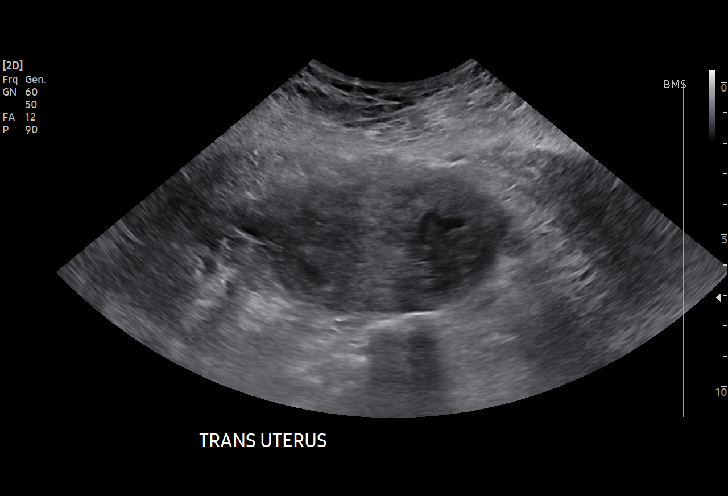
[im 19/82]
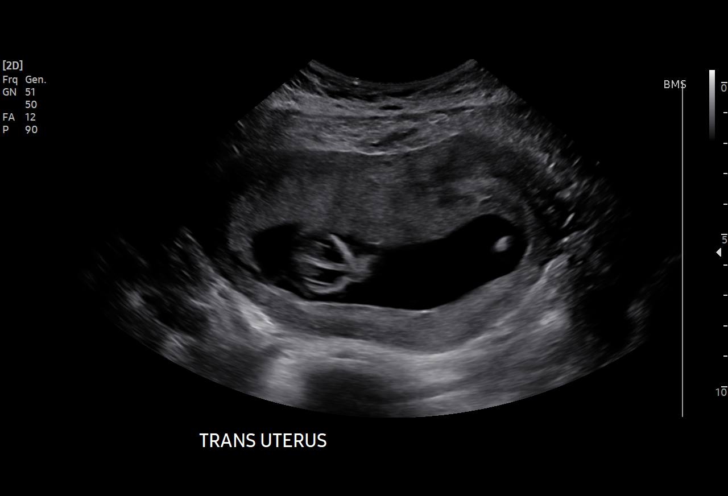
[im 25/82]
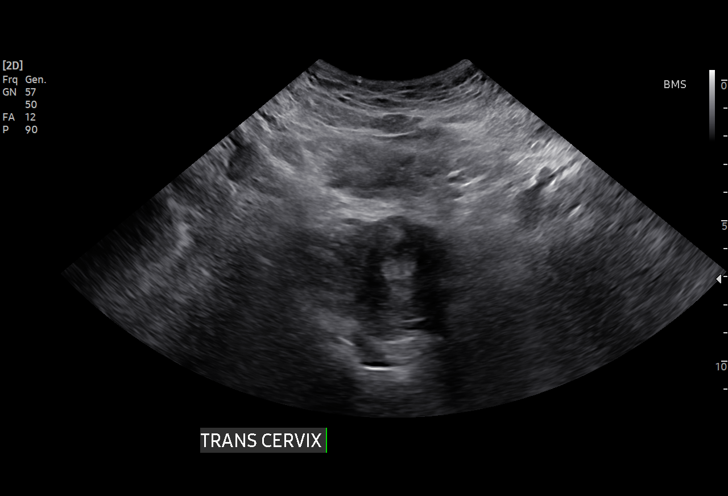
[im 31/82]
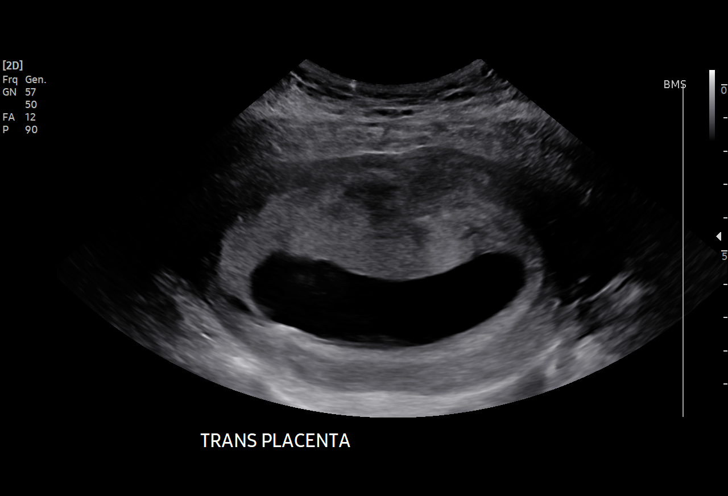
[im 37/82]
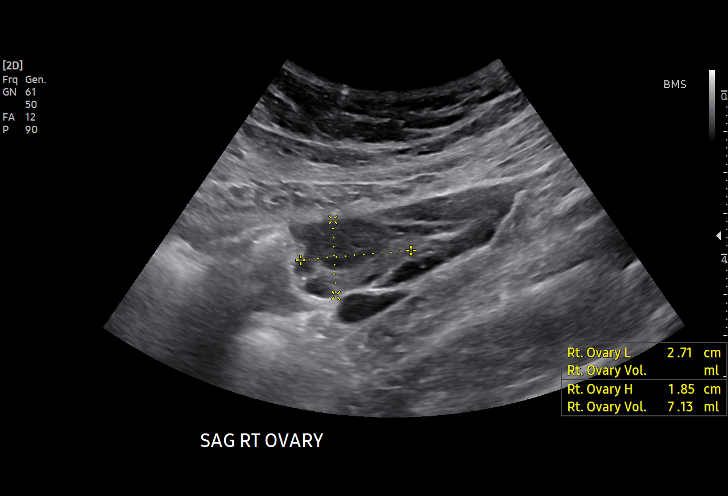
[im 43/82]
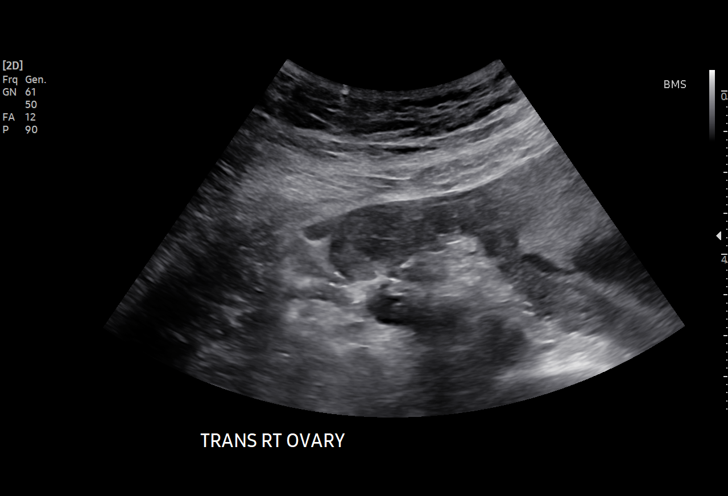
[im 46/82]
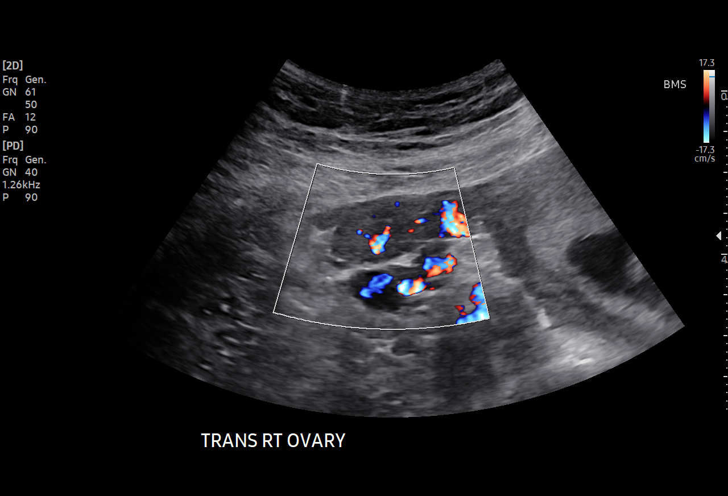
[im 52/82]
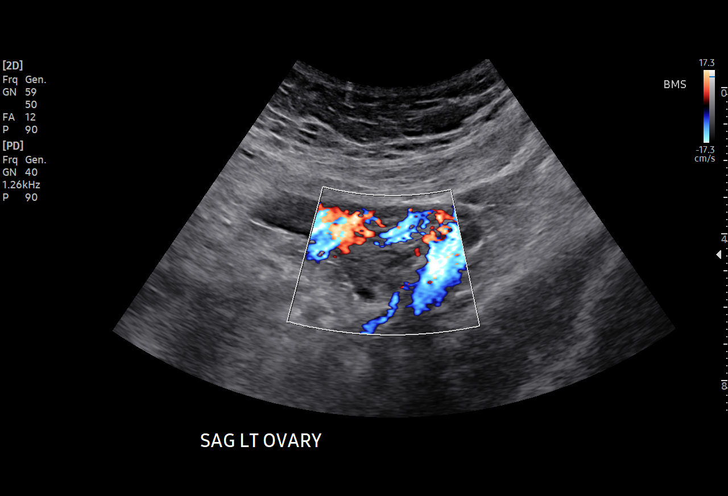
[im 58/82]
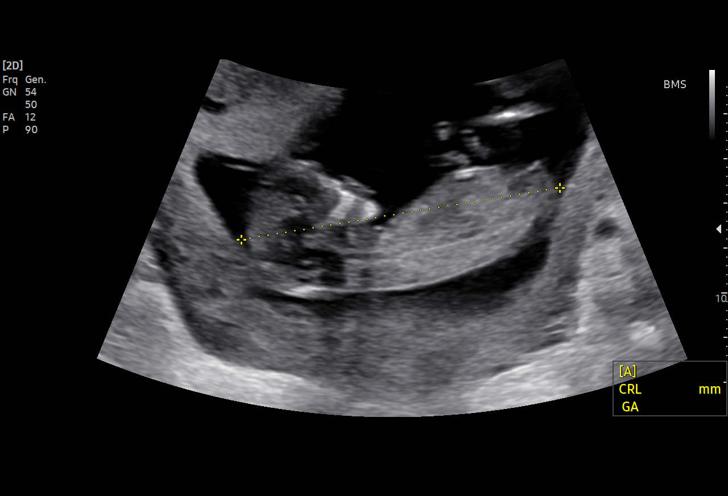
[im 64/82]
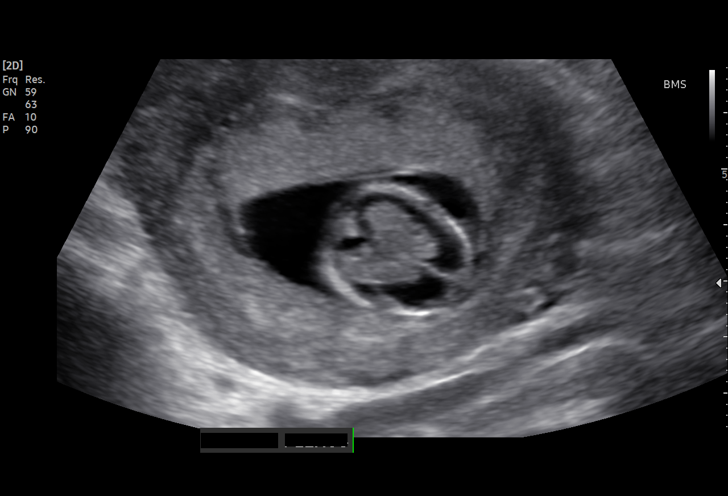
[im 70/82]
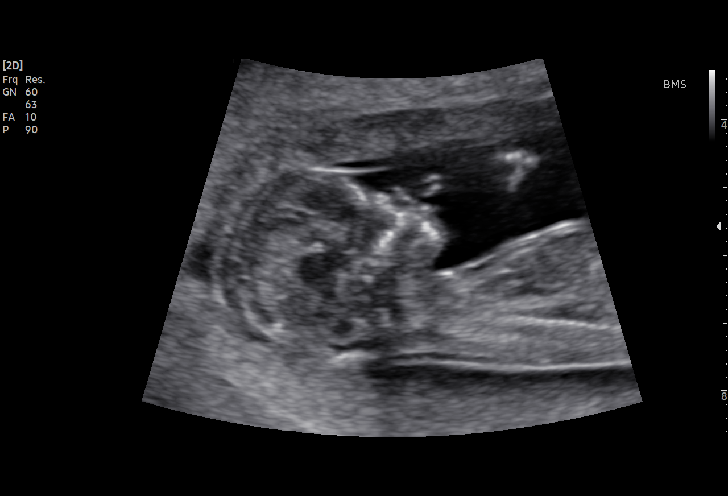
[im 76/82]
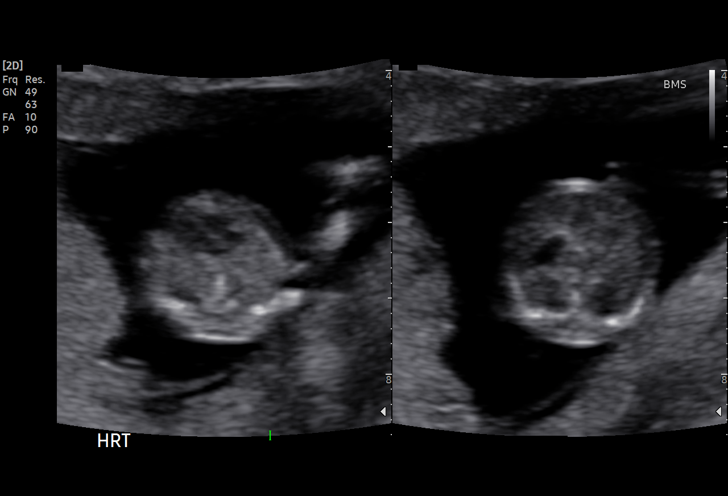
[im 82/82]
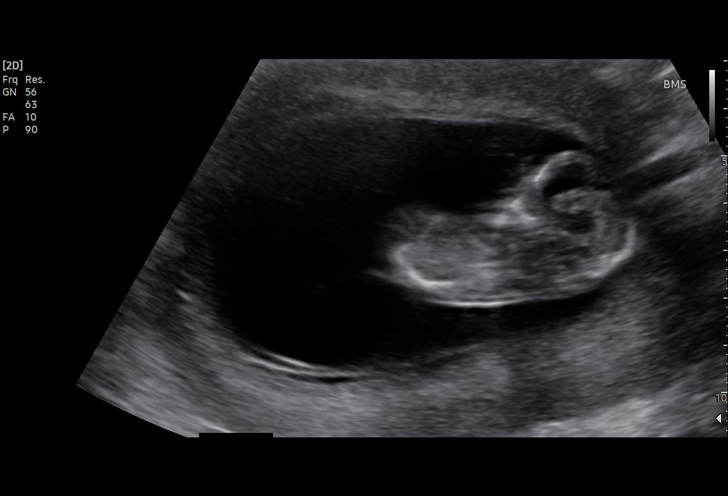

[15 of 28 positions shown; findings below may reference images not displayed]

FINDINGS: LMP: 08/17/2019

GA by LMP: 13 w  1 d

EDC by LMP: 05/23/2020

Intrauterine gestational sac: Single

Yolk sac:  Not Visualized.

Embryo:  Visualized.

Cardiac Activity: Visualized.

Heart Rate: 157 bpm

CRL:   75.2 mm   13 w 4 d                  US EDC: 05/20/2020

Subchorionic hemorrhage:  None

Maternal uterus/adnexae: Normal gravid appearance of the maternal
uterus. Probable corpus luteum seen within the right ovary with an
echogenic focus demonstrating peripheral color flow. Left ovary is
unremarkable. No free fluid in the pelvis.
IMPRESSION: Single intrauterine gestation with an estimated gestational age of
13 weeks, 4 days. Dating is concordant with patient's estimated
gestational age by LMP.

Nonvisualization of the yolk sac likely reflecting physiologic
involution which is common by this age of gestation.

No acute sonographic complication.

## 2022-04-30 ENCOUNTER — Inpatient Hospital Stay (HOSPITAL_COMMUNITY)
Admission: AD | Admit: 2022-04-30 | Discharge: 2022-04-30 | Disposition: A | Discharge status: Home / Self Care | Payer: Medicaid Other | Attending: Obstetrics and Gynecology | Admitting: Obstetrics and Gynecology

## 2022-04-30 ENCOUNTER — Inpatient Hospital Stay (HOSPITAL_COMMUNITY): Payer: Medicaid Other

## 2022-04-30 DIAGNOSIS — O039 Complete or unspecified spontaneous abortion without complication: Secondary | ICD-10-CM | POA: Insufficient documentation

## 2022-04-30 DIAGNOSIS — R1030 Lower abdominal pain, unspecified: Secondary | ICD-10-CM | POA: Insufficient documentation

## 2022-04-30 DIAGNOSIS — Z3A09 9 weeks gestation of pregnancy: Secondary | ICD-10-CM | POA: Diagnosis not present

## 2022-04-30 DIAGNOSIS — O26891 Other specified pregnancy related conditions, first trimester: Secondary | ICD-10-CM | POA: Diagnosis not present

## 2022-04-30 DIAGNOSIS — Z6790 Unspecified blood type, Rh positive: Secondary | ICD-10-CM | POA: Insufficient documentation

## 2022-04-30 LAB — CBC
HCT: 37.9 % (ref 36.0–46.0)
Hemoglobin: 12.1 g/dL (ref 12.0–15.0)
MCH: 23 pg — ABNORMAL LOW (ref 26.0–34.0)
MCHC: 31.9 g/dL (ref 30.0–36.0)
MCV: 72.1 fL — ABNORMAL LOW (ref 80.0–100.0)
Platelets: 346 10*3/uL (ref 150–400)
RBC: 5.26 MIL/uL — ABNORMAL HIGH (ref 3.87–5.11)
RDW: 15.2 % (ref 11.5–15.5)
WBC: 14.2 10*3/uL — ABNORMAL HIGH (ref 4.0–10.5)
nRBC: 0 % (ref 0.0–0.2)

## 2022-04-30 LAB — POCT PREGNANCY, URINE: Preg Test, Ur: POSITIVE — AB

## 2022-04-30 LAB — HCG, QUANTITATIVE, PREGNANCY: hCG, Beta Chain, Quant, S: 1889 m[IU]/mL — ABNORMAL HIGH (ref <5)

## 2022-04-30 NOTE — MAU Provider Note (Signed)
History     CSN: [id number]  Arrival date and time: 04/30/22 1835   Event Date/Time   First Provider Initiated Contact with Patient 04/30/22 2134      Chief Complaint  Patient presents with   Vaginal Bleeding   HPI Linda Holmes is a 32 y.o. [id number] 9 weeks by certain LMP who presents to MAU with chief complaint of vaginal bleeding and concern for miscarriage. She states she started bleeding Saturday 02/24. She believes she passed a large clot containing tissue sometime Saturday. She has continued to experience heavy bleeding since that time. She is not saturating pads in one hour but feels she is very close. Bleeding has slightly improved today. She is not experiencing dizziness, weakness or SOB.   Patient also complaints of lower abdominal cramping. Onset coincides with onset of bleeding. Patient crescendoed Saturday and Sunday then improved to "cramping". On initial CNM assessment in MAU she endorses "almost no" pain.   OB History     Gravida  2   Para  2   Term  2   Preterm      AB      Living  2      SAB      IAB      Ectopic      Multiple  0   Live Births  2           Past Medical History:  Diagnosis Date   Anemia    Medical history non-contributory     Past Surgical History:  Procedure Laterality Date   CESAREAN SECTION N/A 11/24/2018   Procedure: CESAREAN SECTION;  Surgeon: Mora Bellman, MD;  Location: [facility];  Service: Obstetrics;  Laterality: N/A;   CESAREAN SECTION N/A 05/24/2020   Procedure: CESAREAN SECTION;  Surgeon: Truett Mainland, DO;  Location: [facility];  Service: Obstetrics;  Laterality: N/A;    Family History  Problem Relation Age of Onset   Hyperlipidemia Mother    Bipolar disorder Sister    Asthma Daughter     Social History   Tobacco Use   Smoking status: Never   Smokeless tobacco: Never  Vaping Use   Vaping Use: Never used  Substance Use Topics   Alcohol use: Not Currently    Comment: every weekend   Drug  use: Never    Allergies: No Known Allergies  Medications Prior to Admission  Medication Sig Dispense Refill Last Dose   clindamycin-benzoyl peroxide (BENZACLIN) gel Apply topically 2 (two) times daily. Apply to affective areas twice a day (Patient not taking: Reported on 10/20/2021) 50 g 2    medroxyPROGESTERone (PROVERA) 10 MG tablet Take 1 tablet (10 mg total) by mouth daily. Use for ten days (Patient not taking: Reported on 10/20/2021) 10 tablet 2    Prenatal Vit-Fe Fumarate-FA (PREPLUS) 27-1 MG TABS Take 1 tablet by mouth daily. 30 tablet 6    spironolactone (ALDACTONE) 50 MG tablet Take 1 tablet by mouth daily.      spironolactone (ALDACTONE) 50 MG tablet Take 50 mg by mouth daily.       Review of Systems  Gastrointestinal:  Positive for abdominal pain.  Genitourinary:  Positive for vaginal bleeding.  All other systems reviewed and are negative.  Physical Exam   Blood pressure 115/65, pulse 78, temperature 98.3 F (36.8 C), temperature source Oral, resp. rate 16, height '5\' 3"'$  (1.6 m), weight 88.6 kg, SpO2 99 %, currently breastfeeding.  Physical Exam Vitals and nursing note reviewed.  Exam conducted with a chaperone present.  Constitutional:      Appearance: Normal appearance.  Cardiovascular:     Rate and Rhythm: Normal rate.     Pulses: Normal pulses.  Pulmonary:     Effort: Pulmonary effort is normal.  Abdominal:     General: Abdomen is flat.     Tenderness: There is no abdominal tenderness.  Skin:    Capillary Refill: Capillary refill takes less than 2 seconds.  Neurological:     Mental Status: She is alert and oriented to person, place, and time.  Psychiatric:        Mood and Affect: Mood normal.        Behavior: Behavior normal.        Thought Content: Thought content normal.        Judgment: Judgment normal.     MAU Course  Procedures  MDM Orders Placed This Encounter  Procedures   US OB Transvaginal   CBC   hCG, quantitative, pregnancy   Pregnancy,  urine POC   Discharge patient   Patient Vitals for the past 24 hrs:  BP Temp Temp src Pulse Resp SpO2 Height Weight  04/30/22 2218 106/63 98.5 F (36.9 C) Oral 81 16 99 % -- --  04/30/22 1947 115/65 98.3 F (36.8 C) Oral 78 16 99 % '5\' 3"'$  (1.6 m) 88.6 kg   Results for orders placed or performed during the hospital encounter of 04/30/22 (from the past 24 hour(s))  Pregnancy, urine POC     Status: Abnormal   Collection Time: 04/30/22  8:13 PM  Result Value Ref Range   Preg Test, Ur POSITIVE (A) NEGATIVE  CBC     Status: Abnormal   Collection Time: 04/30/22  8:55 PM  Result Value Ref Range   WBC 14.2 (H) 4.0 - 10.5 K/uL   RBC 5.26 (H) 3.87 - 5.11 MIL/uL   Hemoglobin 12.1 12.0 - 15.0 g/dL   HCT 37.9 36.0 - 46.0 %   MCV 72.1 (L) 80.0 - 100.0 fL   MCH 23.0 (L) 26.0 - 34.0 pg   MCHC 31.9 30.0 - 36.0 g/dL   RDW 15.2 11.5 - 15.5 %   Platelets 346 150 - 400 K/uL   nRBC 0.0 0.0 - 0.2 %  hCG, quantitative, pregnancy     Status: Abnormal   Collection Time: 04/30/22  8:55 PM  Result Value Ref Range   hCG, Beta Chain, Quant, S 1,889 (H) <5 mIU/mL   US OB Transvaginal  Result Date: 04/30/2022 CLINICAL DATA:  Bleeding for 3 days. Per patient passed tissue. Beta HCG pending. Last menstrual period 02/25/2022. Gestational age by LMP 9 weeks 1 day EXAM: TRANSVAGINAL OB ULTRASOUND TECHNIQUE: Transvaginal ultrasound was performed for complete evaluation of the gestation as well as the maternal uterus, adnexal regions, and pelvic cul-de-sac. COMPARISON:  None Available. FINDINGS: Intrauterine gestational sac: None Yolk sac:  Not Visualized. Embryo:  Not Visualized. Cardiac Activity: Not Visualized. Heart Rate: Not applicable bpm Subchorionic hemorrhage:  Not applicable. Maternal uterus/adnexae: The endometrium measures 11 mm. Normal right ovary. The left ovary was not visualized. No free fluid. IMPRESSION: No intrauterine gestational sac. In the setting of positive pregnancy test and no definite  intrauterine pregnancy, this reflects a pregnancy of unknown location. Differential considerations include early normal IUP, abnormal IUP, or nonvisualized ectopic pregnancy. Differentiation is achieved with serial beta HCG supplemented by repeat sonography as clinically warranted. Electronically Signed   By: Placido Sou M.D.   On: 04/30/2022  21:31    Assessment and Plan  --32 y.o. G3P2 at 9 weeks with no IUP  --Concern for recent miscarriage --Hgb 12.1, negative for acute signs at this time --Rh POS, Rhogam not indicated --Discharge home in stable condition with bleeding precautions  F/U: --High alert message sent to [id number] to please coordinate office follow-up with provider in about one week  Darlina Rumpf, [place], MSN, CNM 04/30/2022, 11:52 PM

## 2022-04-30 NOTE — MAU Note (Signed)
.  Linda Holmes is a 32 y.o. at Unknown here in MAU reporting: Pt states she is in the process of miscarrying. States she is [redacted] week gestation. Changing her pas ever y hour-they "aren't saturated but full enough they need to be changed". Bleeding began Saturday does report passing tissue.   Onset of complaint: Saturday Pain score: pain more severe Saturday and Sunday. Today has felt more like period cramping Score now:4 Vitals:   04/30/22 1947  BP: 115/65  Pulse: 78  Resp: 16  Temp: 98.3 F (36.8 C)  SpO2: 99%     FHT:N/A Lab orders placed from triage:  None   Pt is unsure if pregnancy has passed and woul like confirmation or medication to complete process

## 2022-05-07 ENCOUNTER — Encounter: Payer: Self-pay | Admitting: Certified Nurse Midwife

## 2022-05-07 ENCOUNTER — Ambulatory Visit: Payer: Medicaid Other | Admitting: Certified Nurse Midwife

## 2022-05-07 VITALS — BP 117/69 | HR 83 | Wt 194.6 lb

## 2022-05-07 DIAGNOSIS — Z8759 Personal history of other complications of pregnancy, childbirth and the puerperium: Secondary | ICD-10-CM | POA: Diagnosis not present

## 2022-05-07 NOTE — Progress Notes (Signed)
History:  [name] is a 32 y.o. [id number] who presents to clinic today for follow up after miscarriage two weeks ago. Has stopped bleeding and has no additional cramping. Is interested in talking about birth control but does not want Depo, pills or Nexplanon.  The following portions of the patient's history were reviewed and updated as appropriate: allergies, current medications, family history, past medical history, social history, past surgical history and problem list.  Review of Systems:  Pertinent items noted in HPI and remainder of comprehensive ROS otherwise negative.   Objective:  Physical Exam BP 117/69   Pulse 83   Wt 194 lb 9.6 oz (88.3 kg)   Breastfeeding No   BMI 34.47 kg/m  Physical Exam Vitals and nursing note reviewed.  Constitutional:      Appearance: Normal appearance.  Eyes:     Pupils: Pupils are equal, round, and reactive to light.  Cardiovascular:     Rate and Rhythm: Normal rate and regular rhythm.  Pulmonary:     Effort: Pulmonary effort is normal.  Musculoskeletal:        General: Normal range of motion.  Skin:    General: Skin is warm and dry.     Capillary Refill: Capillary refill takes less than 2 seconds.  Neurological:     Mental Status: She is alert and oriented to person, place, and time.  Psychiatric:        Mood and Affect: Mood normal.        Behavior: Behavior normal.        Thought Content: Thought content normal.        Judgment: Judgment normal.    Labs and Imaging No results found for this or any previous visit (from the past 24 hour(s)).  No results found.   Assessment & Plan:  1. History of miscarriage - Reviewed methods of birth control in tiered fashion, pt desires to continue NFP with condoms. - CBC - Beta hCG quant (ref lab) - Pap up to date and normal  Follow up PRN or for annual exam  Gabriel Carina, CNM 05/07/2022 4:59 PM

## 2022-05-08 LAB — CBC
Hematocrit: 39.5 % (ref 34.0–46.6)
Hemoglobin: 11.9 g/dL (ref 11.1–15.9)
MCH: 21.9 pg — ABNORMAL LOW (ref 26.6–33.0)
MCHC: 30.1 g/dL — ABNORMAL LOW (ref 31.5–35.7)
MCV: 73 fL — ABNORMAL LOW (ref 79–97)
Platelets: 408 10*3/uL (ref 150–450)
RBC: 5.44 x10E6/uL — ABNORMAL HIGH (ref 3.77–5.28)
RDW: 15 % (ref 11.7–15.4)
WBC: 11.8 10*3/uL — ABNORMAL HIGH (ref 3.4–10.8)

## 2022-05-08 LAB — BETA HCG QUANT (REF LAB): hCG Quant: 105 m[IU]/mL

## 2022-05-22 DIAGNOSIS — L732 Hidradenitis suppurativa: Secondary | ICD-10-CM | POA: Diagnosis not present

## 2022-05-22 DIAGNOSIS — Z5181 Encounter for therapeutic drug level monitoring: Secondary | ICD-10-CM | POA: Diagnosis not present

## 2022-07-27 ENCOUNTER — Ambulatory Visit (INDEPENDENT_AMBULATORY_CARE_PROVIDER_SITE_OTHER): Payer: Medicaid Other

## 2022-07-27 DIAGNOSIS — Z3201 Encounter for pregnancy test, result positive: Secondary | ICD-10-CM | POA: Diagnosis not present

## 2022-07-27 DIAGNOSIS — Z3200 Encounter for pregnancy test, result unknown: Secondary | ICD-10-CM

## 2022-07-27 DIAGNOSIS — Z3490 Encounter for supervision of normal pregnancy, unspecified, unspecified trimester: Secondary | ICD-10-CM

## 2022-07-27 LAB — POCT PREGNANCY, URINE: Preg Test, Ur: POSITIVE — AB

## 2022-07-27 MED ORDER — VITAFOL ULTRA 29-0.6-0.4-200 MG PO CAPS: 1.0000 | ORAL_CAPSULE | Freq: Every day | ORAL | 11 refills | Status: AC

## 2022-07-27 NOTE — Progress Notes (Signed)
Possible Pregnancy  Here today for pregnancy confirmation. UPT in office today is positive. Recent miscarriage February 2024. Last HCG 05/07/22 was 108. Pt reports negative UPT at home on 06/07/22. First positive UPT at home on 07/05/22. Scheduled for dating [place] on 08/14/22. Reviewed medications and allergies with patient; pt stopped spironolactone and Humira following positive UPT at home. Recommended pt begin prenatal vitamin and ordered to pharmacy. Reviewed availability of MAU for any bleeding or pain.   Marjo Bicker, RN 07/27/2022  10:11 AM

## 2022-08-06 DIAGNOSIS — F411 Generalized anxiety disorder: Secondary | ICD-10-CM | POA: Diagnosis not present

## 2022-08-13 DIAGNOSIS — F411 Generalized anxiety disorder: Secondary | ICD-10-CM | POA: Diagnosis not present

## 2022-08-14 ENCOUNTER — Encounter: Payer: Self-pay | Admitting: Advanced Practice Midwife

## 2022-08-14 ENCOUNTER — Ambulatory Visit (INDEPENDENT_AMBULATORY_CARE_PROVIDER_SITE_OTHER): Payer: Medicaid Other | Admitting: Advanced Practice Midwife

## 2022-08-14 ENCOUNTER — Other Ambulatory Visit: Payer: Self-pay | Admitting: Obstetrics and Gynecology

## 2022-08-14 ENCOUNTER — Ambulatory Visit (INDEPENDENT_AMBULATORY_CARE_PROVIDER_SITE_OTHER): Payer: Medicaid Other

## 2022-08-14 DIAGNOSIS — Z3490 Encounter for supervision of normal pregnancy, unspecified, unspecified trimester: Secondary | ICD-10-CM

## 2022-08-14 DIAGNOSIS — Z3491 Encounter for supervision of normal pregnancy, unspecified, first trimester: Secondary | ICD-10-CM

## 2022-08-14 DIAGNOSIS — Z8759 Personal history of other complications of pregnancy, childbirth and the puerperium: Secondary | ICD-10-CM | POA: Diagnosis not present

## 2022-08-14 DIAGNOSIS — Z3200 Encounter for pregnancy test, result unknown: Secondary | ICD-10-CM

## 2022-08-14 DIAGNOSIS — Z3A10 10 weeks gestation of pregnancy: Secondary | ICD-10-CM

## 2022-08-14 NOTE — Progress Notes (Signed)
Ultrasounds Results Note  SUBJECTIVE HPI:  [name] is a 32 y.o. [id number] at [redacted]w[redacted]d by LMP who presents to [facility] for followup ultrasound results. Recent SAB. UPT neg 06/07/22. The patient denies abdominal pain or vaginal bleeding.     Previous HCG's None this pregnancy  Previous US None    A pos   Repeat ultrasound was performed earlier today.   Past Medical History:  Diagnosis Date   Anemia    Hidradenitis suppurativa 07/06/2021   Medical history non-contributory    Past Surgical History:  Procedure Laterality Date   CESAREAN SECTION N/A 11/24/2018   Procedure: CESAREAN SECTION;  Surgeon: Catalina Antigua, MD;  Location: [facility];  Service: Obstetrics;  Laterality: N/A;   CESAREAN SECTION N/A 05/24/2020   Procedure: CESAREAN SECTION;  Surgeon: Levie Heritage, DO;  Location: [facility];  Service: Obstetrics;  Laterality: N/A;   Social History   Socioeconomic History   Marital status: Single    Spouse name: Not on file   Number of children: Not on file   Years of education: 16   Highest education level: Bachelor's degree (e.g., BA, AB, BS)  Occupational History   Not on file  Tobacco Use   Smoking status: Never   Smokeless tobacco: Never  Vaping Use   Vaping Use: Never used  Substance and Sexual Activity   Alcohol use: Not Currently    Comment: every weekend   Drug use: Never   Sexual activity: Yes    Birth control/protection: None  Other Topics Concern   Not on file  Social History Narrative   Not on file   Social Determinants of Health   Financial Resource Strain: Not on file  Food Insecurity: Food Insecurity Present (05/07/2022)   Hunger Vital Sign    Worried About Running Out of Food in the Last Year: Sometimes true    Ran Out of Food in the Last Year: Sometimes true  Transportation Needs: No Transportation Needs (05/07/2022)   PRAPARE - Administrator, Civil Service (Medical): No    Lack of Transportation (Non-Medical):  No  Physical Activity: Not on file  Stress: Not on file  Social Connections: Not on file  Intimate Partner Violence: Not on file   Current Outpatient Medications on File Prior to Visit  Medication Sig Dispense Refill   Prenat-Fe Poly-Methfol-FA-DHA (VITAFOL ULTRA) 29-0.6-0.4-200 MG CAPS Take 1 capsule by mouth daily. 30 capsule 11   No current facility-administered medications on file prior to visit.   No Known Allergies  I have reviewed patient's Past Medical Hx, Surgical Hx, Family Hx, Social Hx, medications and allergies.   Review of Systems Review of Systems  Constitutional: Negative for fever and chills.  Gastrointestinal: Negative for abdominal pain.  Genitourinary: Negative for vaginal bleeding.  Musculoskeletal: Negative for back pain.  Neurological: Negative for dizziness and weakness.    Physical Exam  LMP 06/07/2022   Patient's last menstrual period was 06/07/2022. GENERAL: Well-developed, well-nourished female in no acute distress.  HEENT: Normocephalic, atraumatic.   LUNGS: Effort normal ABDOMEN: Deferred HEART: Regular rate  SKIN: Warm, dry and without erythema PSYCH: Normal mood and affect NEURO: Alert and oriented x 4  LAB RESULTS No results found for this or any previous visit (from the past 24 hour(s)).  IMAGING 10.1 week live IUP  ASSESSMENT 1. [redacted] weeks gestation of pregnancy   2. Ultrasound scan to confirm fetal viability with history of miscarriage   3.  Normal IUP (intrauterine pregnancy) on prenatal ultrasound, first trimester     PLAN Start prenatal care with provider of your choice Patient advised to start taking prenatal vitamins Go to MAU as needed for heavy bleeding, abdominal pain or fever greater than 100.4.  [place], CNM 08/14/2022 10:55 AM

## 2022-08-14 NOTE — Patient Instructions (Signed)
  CenteringPregnancy is a model of prenatal care that started 30 years ago and is used in about 600 practices around the [place]. You meet with a group of 8-12 women due around the same time as you. In Centering you will have individual time with the provider and meet as a group. There's much more time for discussion and learning. You will actually have much more time with your provider in Centering than in traditional prenatal care.? You will come directly into the Centering room and will not wait in the lobby so there is no wasted time. You will have 2-hour visits every 4 weeks then every 2 weeks. You will know your [facility]ering prenatal appointments in advance. In your last month of pregnancy, you may also come in for some individual visits. Additional appointments can be scheduled if you need more care. Studies have shown that [facility]eringPregnancy improves birth outcomes. We have seen especially big improvements in fewer Black women delivering babies who are too small or born too early. Visit the website [facility] for more information. Let your provider or clinic staff know if you want to sign up or email [email or web address] for more information.   [facility]eringPregnancy Video  

## 2022-08-27 DIAGNOSIS — F411 Generalized anxiety disorder: Secondary | ICD-10-CM | POA: Diagnosis not present

## 2022-09-03 ENCOUNTER — Encounter: Payer: Self-pay | Admitting: Obstetrics & Gynecology

## 2022-09-04 ENCOUNTER — Inpatient Hospital Stay (HOSPITAL_COMMUNITY)
Admission: AD | Admit: 2022-09-04 | Discharge: 2022-09-05 | Discharge status: Against Medical Advice | Payer: Medicaid Other | Attending: Family Medicine | Admitting: Family Medicine

## 2022-09-04 DIAGNOSIS — O209 Hemorrhage in early pregnancy, unspecified: Secondary | ICD-10-CM | POA: Insufficient documentation

## 2022-09-04 DIAGNOSIS — Z3A12 12 weeks gestation of pregnancy: Secondary | ICD-10-CM | POA: Diagnosis not present

## 2022-09-04 DIAGNOSIS — N939 Abnormal uterine and vaginal bleeding, unspecified: Secondary | ICD-10-CM | POA: Diagnosis present

## 2022-09-04 LAB — URINALYSIS, ROUTINE W REFLEX MICROSCOPIC
Bilirubin Urine: NEGATIVE
Glucose, UA: NEGATIVE mg/dL
Ketones, ur: NEGATIVE mg/dL
Leukocytes,Ua: NEGATIVE
Nitrite: NEGATIVE
Protein, ur: 30 mg/dL — AB
Specific Gravity, Urine: 1.019 (ref 1.005–1.030)
pH: 5 (ref 5.0–8.0)

## 2022-09-04 NOTE — MAU Note (Signed)
.  Linda Holmes is a 32 y.o. at [redacted]w[redacted]d here in MAU reporting vag bleeding like a period since Friday night. Some cramping Friday night and again today but no pain currently. Used 2 pads today  Onset of complaint: Friday night Pain score: 0 Vitals:   09/04/22 2251 09/04/22 2255  BP:  111/74  Pulse: 82   Resp: 17   Temp: 97.9 F (36.6 C)   SpO2: 100%      FHT:did not listen in Triage Lab orders placed from triage: u/a

## 2022-09-05 ENCOUNTER — Inpatient Hospital Stay (HOSPITAL_COMMUNITY): Payer: Medicaid Other

## 2022-09-05 NOTE — MAU Note (Signed)
Pt called again for u/s and not in lobby

## 2022-09-05 NOTE — MAU Note (Signed)
PT called for u/s and not in lobby or family room. Admission personnel stated pt went to [employer] hour or so ago but have not seen her since

## 2022-09-05 NOTE — MAU Note (Signed)
PT not in lobby when called for u/s

## 2022-09-05 NOTE — MAU Provider Note (Signed)
Chief Complaint: Vaginal Bleeding   None   NOT IN LOBBY      SUBJECTIVE HPI: Linda Holmes is a 32 y.o. [id number] at [redacted]w[redacted]d by LMP who presents to maternity admissions reporting vaginal bleeding since Friday.  Was seen here 2 weeks ago ahd had a normal 10 week IUP..   I ordered an [place] while patient was waiting (due to large volume of patients in MAU with high acuity)  HPI  RN Note: .Linda Holmes is a 32 y.o. at [redacted]w[redacted]d here in MAU reporting vag bleeding like a period since Friday night. Some cramping Friday night and again today but no pain currently. Used 2 pads today  Onset of complaint: Friday night                Pain score: 0  Past Medical History:  Diagnosis Date   Anemia    Hidradenitis suppurativa 07/06/2021   Medical history non-contributory    Past Surgical History:  Procedure Laterality Date   CESAREAN SECTION N/A 11/24/2018   Procedure: CESAREAN SECTION;  Surgeon: Catalina Antigua, MD;  Location: [facility];  Service: Obstetrics;  Laterality: N/A;   CESAREAN SECTION N/A 05/24/2020   Procedure: CESAREAN SECTION;  Surgeon: Levie Heritage, DO;  Location: [facility];  Service: Obstetrics;  Laterality: N/A;   Social History   Socioeconomic History   Marital status: Single    Spouse name: Not on file   Number of children: Not on file   Years of education: 16   Highest education level: Bachelor's degree (e.g., BA, AB, BS)  Occupational History   Not on file  Tobacco Use   Smoking status: Never   Smokeless tobacco: Never  Vaping Use   Vaping Use: Never used  Substance and Sexual Activity   Alcohol use: Not Currently    Comment: every weekend   Drug use: Never   Sexual activity: Yes    Birth control/protection: None  Other Topics Concern   Not on file  Social History Narrative   Not on file   Social Determinants of Health   Financial Resource Strain: Not on file  Food Insecurity: Food Insecurity Present (05/07/2022)   Hunger Vital Sign    Worried About Running  Out of Food in the Last Year: Sometimes true    Ran Out of Food in the Last Year: Sometimes true  Transportation Needs: No Transportation Needs (05/07/2022)   PRAPARE - Administrator, Civil Service (Medical): No    Lack of Transportation (Non-Medical): No  Physical Activity: Not on file  Stress: Not on file  Social Connections: Not on file  Intimate Partner Violence: Not on file   No current facility-administered medications on file prior to encounter.   Current Outpatient Medications on File Prior to Encounter  Medication Sig Dispense Refill   Prenat-Fe Poly-Methfol-FA-DHA (VITAFOL ULTRA) 29-0.6-0.4-200 MG CAPS Take 1 capsule by mouth daily. 30 capsule 11   No Known Allergies  I have reviewed patient's Past Medical Hx, Surgical Hx, Family Hx, Social Hx, medications and allergies.   ROS:  Review of Systems Review of Systems  Unable to do since patient left before being seen   Physical Exam  Physical Exam Patient Vitals for the past 24 hrs:  BP Temp Pulse Resp SpO2 Height Weight  09/04/22 2255 111/74 -- -- -- -- -- --  09/04/22 2251 -- 97.9 F (36.6 C) 82 17 100 % 5\' 2"  (1.575 m) 84.8 kg  LAB RESULTS Results for orders placed or performed during the hospital encounter of 09/04/22 (from the past 24 hour(s))  Urinalysis, Routine w reflex microscopic -Urine, Clean Catch     Status: Abnormal   Collection Time: 09/04/22 11:17 PM  Result Value Ref Range   Color, Urine YELLOW YELLOW   APPearance HAZY (A) CLEAR   Specific Gravity, Urine 1.019 1.005 - 1.030   pH 5.0 5.0 - 8.0   Glucose, UA NEGATIVE NEGATIVE mg/dL   Hgb urine dipstick LARGE (A) NEGATIVE   Bilirubin Urine NEGATIVE NEGATIVE   Ketones, ur NEGATIVE NEGATIVE mg/dL   Protein, ur 30 (A) NEGATIVE mg/dL   Nitrite NEGATIVE NEGATIVE   Leukocytes,Ua NEGATIVE NEGATIVE   RBC / HPF 21-50 0 - 5 RBC/hpf   WBC, UA 6-10 0 - 5 WBC/hpf   Bacteria, UA FEW (A) NONE SEEN   Squamous Epithelial / HPF 0-5 0 - 5 /HPF    Mucus PRESENT        IMAGING Ordered  MAU Management/MDM: I have reviewed the triage vital signs and the nursing notes.   Pertinent labs & imaging results that were available during my care of the patient were reviewed by me and considered in my medical decision making (see chart for details).      I have reviewed her medical records including past results, notes and treatments. Medical, Surgical, and family history were reviewed.  Medications and recent lab tests were reviewed  Ordered Ultrasound to rule out SAB while patient waiting in lobby (high volume and acuity on MAU)    Patient left before [place] was ready Nurse called for her 3 times.   ASSESSMENT SIUP at [redacted]w[redacted]d Bleeding  PLAN Left without being seen   Wynelle Bourgeois CNM, MSN Certified Nurse-Midwife 09/05/2022  1:28 AM

## 2022-09-10 ENCOUNTER — Inpatient Hospital Stay (HOSPITAL_COMMUNITY): Payer: Medicaid Other

## 2022-09-10 ENCOUNTER — Inpatient Hospital Stay (HOSPITAL_COMMUNITY)
Admission: AD | Admit: 2022-09-10 | Discharge: 2022-09-10 | Disposition: A | Discharge status: Home / Self Care | Payer: Medicaid Other | Attending: Family Medicine | Admitting: Family Medicine

## 2022-09-10 DIAGNOSIS — R9389 Abnormal findings on diagnostic imaging of other specified body structures: Secondary | ICD-10-CM | POA: Diagnosis not present

## 2022-09-10 DIAGNOSIS — Z3A13 13 weeks gestation of pregnancy: Secondary | ICD-10-CM

## 2022-09-10 DIAGNOSIS — O3680X0 Pregnancy with inconclusive fetal viability, not applicable or unspecified: Secondary | ICD-10-CM | POA: Diagnosis not present

## 2022-09-10 DIAGNOSIS — O039 Complete or unspecified spontaneous abortion without complication: Secondary | ICD-10-CM | POA: Diagnosis not present

## 2022-09-10 DIAGNOSIS — O209 Hemorrhage in early pregnancy, unspecified: Secondary | ICD-10-CM

## 2022-09-10 DIAGNOSIS — Z3A00 Weeks of gestation of pregnancy not specified: Secondary | ICD-10-CM | POA: Diagnosis not present

## 2022-09-10 LAB — WET PREP, GENITAL
Clue Cells Wet Prep HPF POC: NONE SEEN
Sperm: NONE SEEN
Trich, Wet Prep: NONE SEEN
WBC, Wet Prep HPF POC: <10 (ref <10)
Yeast Wet Prep HPF POC: NONE SEEN

## 2022-09-10 MED ORDER — FERROUS SULFATE 325 (65 FE) MG PO TABS: 325.0000 mg | ORAL_TABLET | ORAL | 0 refills | Status: AC

## 2022-09-10 NOTE — MAU Provider Note (Signed)
History     [id number]  Arrival date and time: 09/10/22 1742   Chief Complaint  Patient presents with   Vaginal Bleeding    HPI Linda Holmes is a 32 y.o. at [redacted]w[redacted]d by LMP who presents for vaginal bleeding and cramping.  Symptoms started 2 days ago.  She noted some blood clots which she passed 3 days ago and bleeding stopped yesterday.  She also experienced abdominal cramping, along with the vaginal bleeding.  Symptoms improved as of yesterday.  She was evaluated here in the 5 days ago and had an US done, however she left before being evaluated by the provider, because it was getting late and she was worried about being able to go into work the next day. She thinks she may have passed the products of conception, and wanted to verify that.    OB History     Gravida  4   Para  2   Term  2   Preterm      AB  1   Living  2      SAB  1   IAB      Ectopic      Multiple  0   Live Births  2           Past Medical History:  Diagnosis Date   Anemia    Hidradenitis suppurativa 07/06/2021   Medical history non-contributory     Past Surgical History:  Procedure Laterality Date   CESAREAN SECTION N/A 11/24/2018   Procedure: CESAREAN SECTION;  Surgeon: Catalina Antigua, MD;  Location: [facility];  Service: Obstetrics;  Laterality: N/A;   CESAREAN SECTION N/A 05/24/2020   Procedure: CESAREAN SECTION;  Surgeon: Levie Heritage, DO;  Location: [facility];  Service: Obstetrics;  Laterality: N/A;    Family History  Problem Relation Age of Onset   Hyperlipidemia Mother    Bipolar disorder Sister    Asthma Daughter     Social History   Socioeconomic History   Marital status: Single    Spouse name: Not on file   Number of children: Not on file   Years of education: 16   Highest education level: Bachelor's degree (e.g., BA, AB, BS)  Occupational History   Not on file  Tobacco Use   Smoking status: Never   Smokeless tobacco: Never  Vaping Use   Vaping Use: Never  used  Substance and Sexual Activity   Alcohol use: Not Currently    Comment: every weekend   Drug use: Never   Sexual activity: Yes    Birth control/protection: None  Other Topics Concern   Not on file  Social History Narrative   Not on file   Social Determinants of Health   Financial Resource Strain: Not on file  Food Insecurity: Food Insecurity Present (05/07/2022)   Hunger Vital Sign    Worried About Running Out of Food in the Last Year: Sometimes true    Ran Out of Food in the Last Year: Sometimes true  Transportation Needs: No Transportation Needs (05/07/2022)   PRAPARE - Administrator, Civil Service (Medical): No    Lack of Transportation (Non-Medical): No  Physical Activity: Not on file  Stress: Not on file  Social Connections: Not on file  Intimate Partner Violence: Not on file    No Known Allergies  No current facility-administered medications on file prior to encounter.   Current Outpatient Medications on File Prior to Encounter  Medication Sig  Dispense Refill   Prenat-Fe Poly-Methfol-FA-DHA (VITAFOL ULTRA) 29-0.6-0.4-200 MG CAPS Take 1 capsule by mouth daily. 30 capsule 11     Review of Systems  Constitutional:  Negative for chills and fever.  Eyes:  Negative for blurred vision.  Cardiovascular:  Negative for leg swelling.  Gastrointestinal:  Negative for abdominal pain and vomiting.  Genitourinary:  Negative for dysuria, frequency and urgency.  Musculoskeletal:  Negative for myalgias.  Skin:  Negative for itching.  Neurological:  Negative for dizziness.   Pertinent positives and negative per HPI, all others reviewed and negative  Physical Exam   BP 109/71 (BP Location: Left Arm)   Pulse 91   Temp 98 F (36.7 C) (Oral)   Resp 16   Ht 5\' 2"  (1.575 m)   Wt 84.6 kg   LMP 06/07/2022   SpO2 98%   BMI 34.13 kg/m   Patient Vitals for the past 24 hrs:  BP Temp Temp src Pulse Resp SpO2 Height Weight  09/10/22 2114 109/71 -- -- 91 -- 98 % --  --  09/10/22 1855 112/73 98 F (36.7 C) Oral 92 16 -- 5\' 2"  (1.575 m) 84.6 kg    Physical Exam Vitals reviewed.  Constitutional:      General: She is not in acute distress.    Appearance: She is well-developed. She is not toxic-appearing.  HENT:     Head: Normocephalic and atraumatic.     Mouth/Throat:     Mouth: Mucous membranes are moist.  Eyes:     Extraocular Movements: Extraocular movements intact.  Cardiovascular:     Rate and Rhythm: Normal rate.  Pulmonary:     Effort: Pulmonary effort is normal. No respiratory distress.  Abdominal:     Palpations: Abdomen is soft.  Skin:    General: Skin is warm and dry.  Neurological:     Mental Status: She is alert and oriented to person, place, and time.  Psychiatric:        Mood and Affect: Mood normal.        Behavior: Behavior normal.     Labs Results for orders placed or performed during the hospital encounter of 09/10/22 (from the past 24 hour(s))  Wet prep, genital     Status: None   Collection Time: 09/10/22  8:07 PM   Specimen: PATH Cytology Cervicovaginal Ancillary Only  Result Value Ref Range   Yeast Wet Prep HPF POC NONE SEEN NONE SEEN   Trich, Wet Prep NONE SEEN NONE SEEN   Clue Cells Wet Prep HPF POC NONE SEEN NONE SEEN   WBC, Wet Prep HPF POC <10 <10   Sperm NONE SEEN     Imaging US OB LESS THAN 14 WEEKS WITH OB TRANSVAGINAL  Result Date: 09/10/2022 CLINICAL DATA:  Vaginal bleeding. EXAM: OBSTETRIC <14 WK [place] AND TRANSVAGINAL OB US TECHNIQUE: Both transabdominal and transvaginal ultrasound examinations were performed for complete evaluation of the gestation as well as the maternal uterus, adnexal regions, and pelvic cul-de-sac. Transvaginal technique was performed to assess early pregnancy. COMPARISON:  August 14, 2022 FINDINGS: Intrauterine gestational sac: None Yolk sac:  Not Visualized. Embryo:  Not Visualized. Cardiac Activity: Not Visualized. Heart Rate: N/A  bpm Maternal uterus/adnexae: The endometrium  measures 10.0 mm in thickness. A small amount of endometrial fluid is noted. The right ovary measures 2.1 cm x 2.4 cm x 1.4 cm and is normal in appearance. The left ovary measures 2.9 cm x 1.0 cm x 2.1 cm and is normal in appearance.  No pelvic free fluid is seen. IMPRESSION: 1. Thickened endometrium, without evidence of an intrauterine pregnancy. 2. Small amount of fluid within the endometrial canal. Electronically Signed   By: Aram Candela M.D.   On: 09/10/2022 20:37    MAU Course  Procedures Lab Orders         Wet prep, genital    Meds ordered this encounter  Medications   ferrous sulfate 325 (65 FE) MG tablet    Sig: Take 1 tablet (325 mg total) by mouth every other day.    Dispense:  30 tablet    Refill:  0   Imaging Orders         US OB LESS THAN 14 WEEKS WITH OB TRANSVAGINAL     MDM Moderate (Level 3-4)  Assessment and Plan  #Complete abortion Vaginal bleeding affecting early pregnancy [redacted] weeks gestation of pregnancy Feeling well at this time. US shows no evidence of embyro or products of conception, indicating a complete abortion.  - Encouraged follow up with Obgyn as she would like to discuss birth control options, possible a LARC. - Rx of ferrous sulfate sent empirically for 1-2 months.  Dispo: discharged to home in stable condition   Allergies as of 09/10/2022   No Known Allergies      Medication List     TAKE these medications    ferrous sulfate 325 (65 FE) MG tablet Take 1 tablet (325 mg total) by mouth every other day.   Vitafol Ultra 29-0.6-0.4-200 MG Caps Take 1 capsule by mouth daily.        Sheppard Evens MD MPH OB Fellow, Faculty Practice [facility], [facility] for [facility] 09/10/2022

## 2022-09-10 NOTE — MAU Note (Signed)
Pt says VB - started on 6-28- red - when she wiped and pad- has continued - but stopped yesterday .  Has passed 2 clots on October 14, 2022 - long and stringy .  Had cramping last visit.   No cramping tonight  Last sex- 3 weeks ago

## 2022-09-10 NOTE — Discharge Instructions (Signed)
Please follow-up outpatient with OB/GYN office to discuss birth control options.  Take ferrous sulfate as prescribed every other day for the next 1-2 months to replenish your blood loss.

## 2022-09-11 ENCOUNTER — Telehealth: Payer: Medicaid Other

## 2022-09-11 LAB — GC/CHLAMYDIA PROBE AMP (~~LOC~~) NOT AT ARMC
Chlamydia: NEGATIVE
Comment: NEGATIVE
Comment: NORMAL
Neisseria Gonorrhea: NEGATIVE

## 2022-09-17 DIAGNOSIS — F411 Generalized anxiety disorder: Secondary | ICD-10-CM | POA: Diagnosis not present

## 2022-09-18 ENCOUNTER — Encounter: Payer: Self-pay | Admitting: Obstetrics & Gynecology

## 2022-12-30 ENCOUNTER — Emergency Department (HOSPITAL_COMMUNITY)
Admission: EM | Admit: 2022-12-30 | Discharge: 2022-12-30 | Disposition: A | Discharge status: Home / Self Care | Payer: Medicaid Other | Attending: Emergency Medicine | Admitting: Emergency Medicine

## 2022-12-30 ENCOUNTER — Encounter (HOSPITAL_COMMUNITY): Payer: Self-pay

## 2022-12-30 DIAGNOSIS — R110 Nausea: Secondary | ICD-10-CM | POA: Diagnosis not present

## 2022-12-30 DIAGNOSIS — R42 Dizziness and giddiness: Secondary | ICD-10-CM | POA: Insufficient documentation

## 2022-12-30 LAB — CBC
HCT: 38.3 % (ref 36.0–46.0)
Hemoglobin: 11.7 g/dL — ABNORMAL LOW (ref 12.0–15.0)
MCH: 22.2 pg — ABNORMAL LOW (ref 26.0–34.0)
MCHC: 30.5 g/dL (ref 30.0–36.0)
MCV: 72.8 fL — ABNORMAL LOW (ref 80.0–100.0)
Platelets: 372 10*3/uL (ref 150–400)
RBC: 5.26 MIL/uL — ABNORMAL HIGH (ref 3.87–5.11)
RDW: 15.7 % — ABNORMAL HIGH (ref 11.5–15.5)
WBC: 10.1 10*3/uL (ref 4.0–10.5)
nRBC: 0 % (ref 0.0–0.2)

## 2022-12-30 LAB — BASIC METABOLIC PANEL
Anion gap: 7 (ref 5–15)
BUN: 14 mg/dL (ref 6–20)
CO2: 24 mmol/L (ref 22–32)
Calcium: 9.1 mg/dL (ref 8.9–10.3)
Chloride: 107 mmol/L (ref 98–111)
Creatinine, Ser: 0.48 mg/dL (ref 0.44–1.00)
GFR, Estimated: >60 mL/min (ref >60)
Glucose, Bld: 124 mg/dL — ABNORMAL HIGH (ref 70–99)
Potassium: 3.6 mmol/L (ref 3.5–5.1)
Sodium: 138 mmol/L (ref 135–145)

## 2022-12-30 LAB — CBG MONITORING, ED: Glucose-Capillary: 77 mg/dL (ref 70–99)

## 2022-12-30 LAB — HCG, SERUM, QUALITATIVE: Preg, Serum: NEGATIVE

## 2022-12-30 MED ORDER — MECLIZINE HCL 12.5 MG PO TABS: 12.5000 mg | ORAL_TABLET | Freq: Three times a day (TID) | ORAL | 0 refills | Status: AC | PRN

## 2022-12-30 MED ORDER — MECLIZINE HCL 25 MG PO TABS
25.0000 mg | ORAL_TABLET | Freq: Once | ORAL | Status: AC
  Administered 2022-12-30: 25 mg via ORAL
  Filled 2022-12-30: qty 1

## 2022-12-30 NOTE — ED Triage Notes (Signed)
Pt presents with c/o dizziness that started this morning when she woke up. Pt reports she drank some water and vomited that up. Pt reports she took a shower but then felt like she was going to pass out in the shower.

## 2022-12-30 NOTE — ED Provider Notes (Signed)
[place] EMERGENCY DEPARTMENT AT [facility] Provider Note   CSN: [id number] Arrival date & time: 12/30/22  1528     History  Chief Complaint  Patient presents with   Dizziness    Linda Holmes is a 32 y.o. female with a history of anemia presents the ED today for dizziness.  Patient reports she woke up this morning feeling like the room was spinning.  She states that the sensation was worse with movement of her head.  Patient had associated nausea with the room spinning sensation.  She tried to drink some water and throwing it up.  She tried to take a shower and felt like she was going to fall with that.  Patient reports that the room spinning sensation has subsided on arrival to the ED.  Denies weakness, inability to walk, fever, nor head injury.  No recent sickness, vomiting, or diarrhea.    Home Medications Prior to Admission medications   Medication Sig Start Date End Date Taking? Authorizing Provider  meclizine (ANTIVERT) 12.5 MG tablet Take 1 tablet (12.5 mg total) by mouth 3 (three) times daily as needed for up to 14 days for dizziness. 12/30/22 01/13/23 Yes Maxwell Marion, PA-C  ferrous sulfate 325 (65 FE) MG tablet Take 1 tablet (325 mg total) by mouth every other day. 09/10/22 11/09/22  Ndulue, Chiagoziem J, MD  Prenat-Fe Poly-Methfol-FA-DHA (VITAFOL ULTRA) 29-0.6-0.4-200 MG CAPS Take 1 capsule by mouth daily. 07/27/22   Venora Maples, MD      Allergies    Patient has no known allergies.    Review of Systems   Review of Systems  Neurological:  Positive for dizziness.  All other systems reviewed and are negative.   Physical Exam Updated Vital Signs BP 100/72   Pulse 86   Temp 97.9 F (36.6 C) (Oral)   Resp 12   LMP 12/24/2022 (Approximate)   SpO2 100%   Breastfeeding Unknown  Physical Exam Vitals and nursing note reviewed.  Constitutional:      Appearance: Normal appearance.  HENT:     Head: Normocephalic and atraumatic.     Mouth/Throat:      Mouth: Mucous membranes are moist.  Eyes:     Conjunctiva/sclera: Conjunctivae normal.     Pupils: Pupils are equal, round, and reactive to light.  Cardiovascular:     Rate and Rhythm: Normal rate and regular rhythm.     Pulses: Normal pulses.     Heart sounds: Normal heart sounds.  Pulmonary:     Effort: Pulmonary effort is normal.     Breath sounds: Normal breath sounds.  Abdominal:     Palpations: Abdomen is soft.     Tenderness: There is no abdominal tenderness.  Musculoskeletal:        General: Normal range of motion.     Comments: Strength and sensation of upper and lower extremities intact.  Patient ambulated reportedly in the room.  This did make her feel like the room was spinning a little.  Skin:    General: Skin is warm and dry.     Findings: No rash.  Neurological:     General: No focal deficit present.     Mental Status: She is alert.     Sensory: No sensory deficit.     Motor: No weakness.     Gait: Gait normal.  Psychiatric:        Mood and Affect: Mood normal.        Behavior: Behavior normal.  ED Results / Procedures / Treatments   Labs (all labs ordered are listed, but only abnormal results are displayed) Labs Reviewed  BASIC METABOLIC PANEL - Abnormal; Notable for the following components:      Result Value   Glucose, Bld 124 (*)    All other components within normal limits  CBC - Abnormal; Notable for the following components:   RBC 5.26 (*)    Hemoglobin 11.7 (*)    MCV 72.8 (*)    MCH 22.2 (*)    RDW 15.7 (*)    All other components within normal limits  HCG, SERUM, QUALITATIVE  CBG MONITORING, ED    EKG None  Radiology No results found.  Procedures Procedures: not indicated.   Medications Ordered in ED Medications  meclizine (ANTIVERT) tablet 25 mg (25 mg Oral Given 12/30/22 1618)    ED Course/ Medical Decision Making/ A&P                                 Medical Decision Making Amount and/or Complexity of Data  Reviewed Labs: ordered.   This patient presents to the ED for concern of dizziness, this involves an extensive number of treatment options, and is a complaint that carries with it a high risk of complications and morbidity.   Differential diagnosis includes: BPPV, Meniere's disease, vestibular neuritis, orthostatic hypotension, arrhythmia, dehydration, anemia, electrolyte derangement, hypoglycemia, etc. Low suspicion for posterior stroke - no ataxia/neuro deficits and symptoms resolved prior to arrival.   Comorbidities  See HPI above   Additional History  Additional history obtained from prior records.   Cardiac Monitoring / EKG  The patient was maintained on a cardiac monitor.  I personally viewed and interpreted the cardiac monitored which showed: sinus tachycardia with a heart rate of 105 bpm. Patient not tachycardic on monitor at time of reevaluation. HR of 92. CBG of 77.   Lab Tests  I ordered and personally interpreted labs.  The pertinent results include:   BMP and CBC within normal limits - no acute anemia, infection, electrolyte derangement, or AKI Negative pregnancy test   Problem List / ED Course / Critical Interventions / Medication Management  Room spinning sensation I ordered medications including: Meclizine for dizziness  Reevaluation of the patient after these medicines showed that the patient improved I have reviewed the patients home medicines and have made adjustments as needed   Social Determinants of Health  Housing   Test / Admission - Considered  Discussed findings with patient.  All questions were answered. She is dynamically stable and safe for discharge home. Return precautions provided.       Final Clinical Impression(s) / ED Diagnoses Final diagnoses:  Vertigo    Rx / DC Orders ED Discharge Orders          Ordered    meclizine (ANTIVERT) 12.5 MG tablet  3 times daily PRN        12/30/22 1720              Maxwell Marion, PA-C 12/30/22 1727    Gloris Manchester, MD 12/30/22 2355

## 2022-12-30 NOTE — Discharge Instructions (Addendum)
Discussed, you likely have benign positional vertigo causing your dizziness.  I have attached information for the sublingual You can do at home to help prevent symptoms from reoccurring.  Additionally have sent a prescription of meclizine to your pharmacy you can take this as needed for the room spinning sensation.  Follow-up with your PCP in 5 days for reevaluation of your symptoms.  Get help right away if you: Have difficulty speaking or moving. Are always dizzy or faint. Develop severe headaches. Have weakness in your legs or arms. Have changes in your hearing or vision. Develop a stiff neck. Develop sensitivity to light.

## 2023-01-30 ENCOUNTER — Other Ambulatory Visit (HOSPITAL_COMMUNITY)
Admission: RE | Admit: 2023-01-30 | Discharge: 2023-01-30 | Disposition: A | Discharge status: Home / Self Care | Payer: Medicaid Other | Source: Ambulatory Visit | Attending: Primary Care | Admitting: Primary Care

## 2023-01-30 ENCOUNTER — Ambulatory Visit (INDEPENDENT_AMBULATORY_CARE_PROVIDER_SITE_OTHER): Payer: Medicaid Other | Admitting: Primary Care

## 2023-01-30 ENCOUNTER — Encounter (INDEPENDENT_AMBULATORY_CARE_PROVIDER_SITE_OTHER): Payer: Self-pay | Admitting: Primary Care

## 2023-01-30 VITALS — BP 102/67 | HR 73 | Resp 16 | Ht 63.0 in | Wt 175.8 lb

## 2023-01-30 DIAGNOSIS — Z0000 Encounter for general adult medical examination without abnormal findings: Secondary | ICD-10-CM | POA: Diagnosis not present

## 2023-01-30 DIAGNOSIS — Z113 Encounter for screening for infections with a predominantly sexual mode of transmission: Secondary | ICD-10-CM | POA: Diagnosis not present

## 2023-01-30 DIAGNOSIS — Z2821 Immunization not carried out because of patient refusal: Secondary | ICD-10-CM

## 2023-02-01 LAB — CERVICOVAGINAL ANCILLARY ONLY
Candida Glabrata: NEGATIVE
Candida Vaginitis: NEGATIVE
Chlamydia: NEGATIVE
Comment: NEGATIVE
Comment: NEGATIVE
Comment: NEGATIVE
Comment: NEGATIVE
Comment: NORMAL
Neisseria Gonorrhea: NEGATIVE
Trichomonas: NEGATIVE

## 2023-02-05 NOTE — Progress Notes (Signed)
[facility]  Linda Holmes is a 32 y.o. female presents to office today for annual physical exam examination.    Concerns today include: 1. STD screening Occupation: , Marital status: S, Substance use: N Diet: none, Exercise: yes  Health Maintenance  Topic Date Due   COVID-19 Vaccine (1) Never done   INFLUENZA VACCINE  06/03/2023 (Originally 10/04/2022)   Cervical Cancer Screening (HPV/Pap Cotest)  09/09/2026   DTaP/Tdap/Td (2 - Td or Tdap) 02/28/2030   Hepatitis C Screening  Completed   HIV Screening  Completed   HPV VACCINES  Aged Out     Past Medical History:  Diagnosis Date   Anemia    Hidradenitis suppurativa 07/06/2021   Medical history non-contributory    Social History   Socioeconomic History   Marital status: Single    Spouse name: Not on file   Number of children: Not on file   Years of education: 16   Highest education level: Bachelor's degree (e.g., BA, AB, BS)  Occupational History   Not on file  Tobacco Use   Smoking status: Never   Smokeless tobacco: Never  Vaping Use   Vaping status: Never Used  Substance and Sexual Activity   Alcohol use: Not Currently    Comment: every weekend   Drug use: Never   Sexual activity: Yes    Birth control/protection: None  Other Topics Concern   Not on file  Social History Narrative   Not on file   Social Determinants of Health   Financial Resource Strain: Medium Risk (01/30/2023)   Overall Financial Resource Strain (CARDIA)    Difficulty of Paying Living Expenses: Somewhat hard  Food Insecurity: Food Insecurity Present (05/07/2022)   Hunger Vital Sign    Worried About Running Out of Food in the Last Year: Sometimes true    Ran Out of Food in the Last Year: Sometimes true  Transportation Needs: Unmet Transportation Needs (01/30/2023)   PRAPARE - Transportation    Lack of Transportation (Medical): Yes    Lack of Transportation (Non-Medical): Yes  Physical Activity: Insufficiently Active  (01/30/2023)   Exercise Vital Sign    Days of Exercise per Week: 2 days    Minutes of Exercise per Session: 30 min  Stress: No Stress Concern Present (01/30/2023)   [employer] of Occupational Health - Occupational Stress Questionnaire    Feeling of Stress : Not at all  Social Connections: Unknown (01/30/2023)   Social Connection and Isolation Panel [NHANES]    Frequency of Communication with Friends and Family: More than three times a week    Frequency of Social Gatherings with Friends and Family: Twice a week    Attends Religious Services: Never    Database administrator or Organizations: No    Attends Banker Meetings: Never    Marital Status: Not on file  Intimate Partner Violence: Not At Risk (01/30/2023)   Humiliation, Afraid, Rape, and Kick questionnaire    Fear of Current or Ex-Partner: No    Emotionally Abused: No    Physically Abused: No    Sexually Abused: No   Past Surgical History:  Procedure Laterality Date   CESAREAN SECTION N/A 11/24/2018   Procedure: CESAREAN SECTION;  Surgeon: Catalina Antigua, MD;  Location: [facility];  Service: Obstetrics;  Laterality: N/A;   CESAREAN SECTION N/A 05/24/2020   Procedure: CESAREAN SECTION;  Surgeon: Levie Heritage, DO;  Location: [facility];  Service: Obstetrics;  Laterality: N/A;   Family  History  Problem Relation Age of Onset   Hyperlipidemia Mother    Bipolar disorder Sister    Asthma Daughter     Current Outpatient Medications:    ferrous sulfate 325 (65 FE) MG tablet, Take 1 tablet (325 mg total) by mouth every other day., Disp: 30 tablet, Rfl: 0   Prenat-Fe Poly-Methfol-FA-DHA (VITAFOL ULTRA) 29-0.6-0.4-200 MG CAPS, Take 1 capsule by mouth daily., Disp: 30 capsule, Rfl: 11 Outpatient Encounter Medications as of 01/30/2023  Medication Sig   ferrous sulfate 325 (65 FE) MG tablet Take 1 tablet (325 mg total) by mouth every other day.   Prenat-Fe Poly-Methfol-FA-DHA (VITAFOL ULTRA) 29-0.6-0.4-200 MG CAPS  Take 1 capsule by mouth daily.   No facility-administered encounter medications on file as of 01/30/2023.    No Known Allergies   ROS: Review of Systems Pertinent items noted in HPI and remainder of comprehensive ROS otherwise negative.    Physical exam  Physical exam: General: Vital signs reviewed.  Patient is well-developed and well-nourished,obese female in no acute distress and cooperative with exam. Head: Normocephalic and atraumatic. Eyes: EOMI, conjunctivae normal, no scleral icterus. Neck: Supple, trachea midline, normal ROM, no JVD, masses, thyromegaly, or carotid bruit present. Cardiovascular: RRR, S1 normal, S2 normal, no murmurs, gallops, or rubs. Pulmonary/Chest: Clear to auscultation bilaterally, no wheezes, rales, or rhonchi. Abdominal: Soft, non-tender, non-distended, BS +, no masses, organomegaly, or guarding present. Musculoskeletal: No joint deformities, erythema, or stiffness, ROM full and nontender. Extremities: No lower extremity edema bilaterally,  pulses symmetric and intact bilaterally. No cyanosis or clubbing. Neurological: A&O x3, Strength is normal Skin: Warm, dry and intact. No rashes or erythema. Psychiatric: Normal mood and affect. speech and behavior is normal. Cognition and memory are normal.      Assessment/ Plan: Rich Number here for annual physical exam.   Linda Holmes was seen today for annual exam.  Diagnoses and all orders for this visit:  Annual physical exam  Screening examination for STD (sexually transmitted disease) -     Cervicovaginal ancillary only -     Cancel: HIV antibody (with reflex)  Influenza vaccination declined      Counseled on healthy lifestyle choices, including diet (rich in fruits, vegetables and lean meats and low in salt and simple carbohydrates) and exercise (at least 30 minutes of moderate physical activity daily).  Patient to follow up in 1 year for annual exam or sooner if needed.  The above assessment  and management plan was discussed with the patient. The patient verbalized understanding of and has agreed to the management plan. Patient is aware to call the clinic if symptoms persist or worsen. Patient is aware when to return to the clinic for a follow-up visit. Patient educated on when it is appropriate to go to the emergency department.   This note has been created with Education officer, environmental. Any transcriptional errors are unintentional.   Grayce Sessions, NP 02/05/2023, 3:38 PM

## 2023-02-06 ENCOUNTER — Encounter (INDEPENDENT_AMBULATORY_CARE_PROVIDER_SITE_OTHER): Payer: Self-pay | Admitting: Primary Care

## 2023-02-10 ENCOUNTER — Other Ambulatory Visit: Payer: Self-pay | Admitting: Obstetrics and Gynecology

## 2023-02-10 DIAGNOSIS — N911 Secondary amenorrhea: Secondary | ICD-10-CM

## 2023-06-11 DIAGNOSIS — L732 Hidradenitis suppurativa: Secondary | ICD-10-CM | POA: Diagnosis not present

## 2023-06-11 DIAGNOSIS — Z5181 Encounter for therapeutic drug level monitoring: Secondary | ICD-10-CM | POA: Diagnosis not present

## 2023-08-19 DIAGNOSIS — F411 Generalized anxiety disorder: Secondary | ICD-10-CM | POA: Diagnosis not present

## 2023-08-30 DIAGNOSIS — F411 Generalized anxiety disorder: Secondary | ICD-10-CM | POA: Diagnosis not present

## 2023-09-03 DIAGNOSIS — F411 Generalized anxiety disorder: Secondary | ICD-10-CM | POA: Diagnosis not present

## 2023-09-10 DIAGNOSIS — F411 Generalized anxiety disorder: Secondary | ICD-10-CM | POA: Diagnosis not present

## 2023-09-23 DIAGNOSIS — F411 Generalized anxiety disorder: Secondary | ICD-10-CM | POA: Diagnosis not present

## 2023-09-30 DIAGNOSIS — F411 Generalized anxiety disorder: Secondary | ICD-10-CM | POA: Diagnosis not present

## 2023-10-08 DIAGNOSIS — F411 Generalized anxiety disorder: Secondary | ICD-10-CM | POA: Diagnosis not present

## 2023-10-16 DIAGNOSIS — F411 Generalized anxiety disorder: Secondary | ICD-10-CM | POA: Diagnosis not present

## 2023-10-25 DIAGNOSIS — F411 Generalized anxiety disorder: Secondary | ICD-10-CM | POA: Diagnosis not present

## 2023-10-29 DIAGNOSIS — Z5181 Encounter for therapeutic drug level monitoring: Secondary | ICD-10-CM | POA: Diagnosis not present

## 2023-10-29 DIAGNOSIS — L732 Hidradenitis suppurativa: Secondary | ICD-10-CM | POA: Diagnosis not present

## 2023-10-30 DIAGNOSIS — F411 Generalized anxiety disorder: Secondary | ICD-10-CM | POA: Diagnosis not present

## 2023-11-05 DIAGNOSIS — F411 Generalized anxiety disorder: Secondary | ICD-10-CM | POA: Diagnosis not present

## 2023-11-13 DIAGNOSIS — F411 Generalized anxiety disorder: Secondary | ICD-10-CM | POA: Diagnosis not present

## 2023-11-15 ENCOUNTER — Other Ambulatory Visit: Payer: Self-pay | Admitting: Medical Genetics

## 2023-11-20 DIAGNOSIS — F411 Generalized anxiety disorder: Secondary | ICD-10-CM | POA: Diagnosis not present

## 2023-11-25 DIAGNOSIS — F411 Generalized anxiety disorder: Secondary | ICD-10-CM | POA: Diagnosis not present

## 2023-12-12 DIAGNOSIS — L732 Hidradenitis suppurativa: Secondary | ICD-10-CM | POA: Diagnosis not present

## 2023-12-20 DIAGNOSIS — F411 Generalized anxiety disorder: Secondary | ICD-10-CM | POA: Diagnosis not present

## 2023-12-26 ENCOUNTER — Other Ambulatory Visit: Payer: Self-pay

## 2023-12-26 ENCOUNTER — Other Ambulatory Visit (HOSPITAL_COMMUNITY)
Admission: RE | Admit: 2023-12-26 | Discharge: 2023-12-26 | Disposition: A | Source: Ambulatory Visit | Attending: Family Medicine | Admitting: Family Medicine

## 2023-12-26 ENCOUNTER — Ambulatory Visit (INDEPENDENT_AMBULATORY_CARE_PROVIDER_SITE_OTHER): Admitting: *Deleted

## 2023-12-26 VITALS — BP 105/62 | HR 98 | Ht 63.0 in | Wt 180.1 lb

## 2023-12-26 DIAGNOSIS — Z202 Contact with and (suspected) exposure to infections with a predominantly sexual mode of transmission: Secondary | ICD-10-CM

## 2023-12-26 DIAGNOSIS — N898 Other specified noninflammatory disorders of vagina: Secondary | ICD-10-CM | POA: Diagnosis not present

## 2023-12-26 LAB — POCT PREGNANCY, URINE: Preg Test, Ur: NEGATIVE

## 2023-12-26 NOTE — Progress Notes (Signed)
 Here for self swab for c/o vaginal itching and vaginal discharge. She has had a new partner. She would like to do full wet prep and blood work. She also would like to do a pregnancy test. Self swab obtained. UPT negative.  I informed her if any results positive she will be notified with treatment. I also advised her it is time to schedule annual exam. She voices understanding.  Rock Skip PEAK

## 2023-12-27 ENCOUNTER — Ambulatory Visit: Payer: Self-pay | Admitting: Nurse Practitioner

## 2023-12-27 DIAGNOSIS — B9689 Other specified bacterial agents as the cause of diseases classified elsewhere: Secondary | ICD-10-CM

## 2023-12-27 DIAGNOSIS — F411 Generalized anxiety disorder: Secondary | ICD-10-CM | POA: Diagnosis not present

## 2023-12-27 LAB — CERVICOVAGINAL ANCILLARY ONLY
Bacterial Vaginitis (gardnerella): POSITIVE — AB
Candida Glabrata: NEGATIVE
Candida Vaginitis: NEGATIVE
Chlamydia: NEGATIVE
Comment: NEGATIVE
Comment: NEGATIVE
Comment: NEGATIVE
Comment: NEGATIVE
Comment: NEGATIVE
Comment: NORMAL
Neisseria Gonorrhea: NEGATIVE
Trichomonas: NEGATIVE

## 2023-12-27 LAB — HIV ANTIBODY (ROUTINE TESTING W REFLEX): HIV Screen 4th Generation wRfx: NONREACTIVE

## 2023-12-27 LAB — HEPATITIS B SURFACE ANTIGEN: Hepatitis B Surface Ag: NEGATIVE

## 2023-12-27 LAB — HEPATITIS C ANTIBODY: Hep C Virus Ab: NONREACTIVE

## 2023-12-27 LAB — RPR: RPR Ser Ql: NONREACTIVE

## 2023-12-27 MED ORDER — METRONIDAZOLE 500 MG PO TABS: 500.0000 mg | ORAL_TABLET | Freq: Two times a day (BID) | ORAL | 0 refills | Status: AC

## 2024-01-22 DIAGNOSIS — F411 Generalized anxiety disorder: Secondary | ICD-10-CM | POA: Diagnosis not present

## 2024-02-19 DIAGNOSIS — F411 Generalized anxiety disorder: Secondary | ICD-10-CM | POA: Diagnosis not present

## 2024-02-20 ENCOUNTER — Other Ambulatory Visit: Payer: Self-pay

## 2024-02-20 ENCOUNTER — Other Ambulatory Visit (HOSPITAL_COMMUNITY)
Admission: RE | Admit: 2024-02-20 | Discharge: 2024-02-20 | Disposition: A | Source: Ambulatory Visit | Attending: Obstetrics and Gynecology | Admitting: Obstetrics and Gynecology

## 2024-02-20 ENCOUNTER — Ambulatory Visit (INDEPENDENT_AMBULATORY_CARE_PROVIDER_SITE_OTHER): Admitting: Obstetrics and Gynecology

## 2024-02-20 VITALS — BP 113/72 | HR 87 | Wt 181.2 lb

## 2024-02-20 DIAGNOSIS — N760 Acute vaginitis: Secondary | ICD-10-CM | POA: Insufficient documentation

## 2024-02-20 DIAGNOSIS — Z113 Encounter for screening for infections with a predominantly sexual mode of transmission: Secondary | ICD-10-CM

## 2024-02-20 DIAGNOSIS — Z01419 Encounter for gynecological examination (general) (routine) without abnormal findings: Secondary | ICD-10-CM | POA: Diagnosis not present

## 2024-02-20 NOTE — Progress Notes (Signed)
 ANNUAL EXAM Patient name: Linda Holmes MRN [id number]  Date of birth: 11-01-1990 Chief Complaint:   Gynecologic Exam  History of Present Illness:   Linda Holmes is a 33 y.o. [phone] being seen today for a routine annual exam.  Current complaints: annual, mild itching  Menstrual concerns? No  normal - last one stopped briefly but otherwise normal Breast or nipple changes? No  Contraception use? No  Sexually active? Yes   Has been having vulvovaginal itching intermittently and not sure if due to BV or yeast, which has had before   Patient's last menstrual period was 02/13/2024.   The pregnancy intention screening data noted above was reviewed. Potential methods of contraception were discussed. The patient elected to proceed with No data recorded.   Last pap     Component Value Date/Time   DIAGPAP  09/08/2021 1026    - Negative for intraepithelial lesion or malignancy (NILM)   HPVHIGH Negative 09/08/2021 1026   ADEQPAP  09/08/2021 1026    Satisfactory for evaluation; transformation zone component PRESENT.   Last mammogram: n/a.  Last colonoscopy: n/a.      01/30/2023   10:23 AM 05/07/2022    3:31 PM 11/24/2021   12:01 PM 10/20/2021   11:16 AM 09/08/2021   10:05 AM  Depression screen PHQ 2/9  Decreased Interest 0 0 0 0 0  Down, Depressed, Hopeless 0 0 0 0 0  PHQ - 2 Score 0 0 0 0 0  Altered sleeping 1 0 0 0 0  Tired, decreased energy 1 0 0 0 1  Change in appetite 0 0 0 0 0  Feeling bad or failure about yourself  1 1 1 1 1   Trouble concentrating 0 0 0 0 0  Moving slowly or fidgety/restless 0 0 0 0 0  Suicidal thoughts 0 0 0 0 0  PHQ-9 Score 3  1  1  1  2       Data saved with a previous flowsheet row definition        01/30/2023   10:24 AM 05/07/2022    3:31 PM 11/24/2021   12:01 PM 10/20/2021   11:16 AM  GAD 7 : Generalized Anxiety Score  Nervous, Anxious, on Edge 1 1 0 0  Control/stop worrying 1 1 1  0  Worry too much - different things 1 1 1 1   Trouble  relaxing 0 0 0 0  Restless 0 0 0 0  Easily annoyed or irritable 1 1 1  0  Afraid - awful might happen 0 0 0 0  Total GAD 7 Score 4 4 3 1      Review of Systems:   Pertinent items are noted in HPI Denies any headaches, blurred vision, fatigue, shortness of breath, chest pain, abdominal pain, abnormal vaginal discharge/itching/odor/irritation, problems with periods, bowel movements, urination, or intercourse unless otherwise stated above. Pertinent History Reviewed:  Reviewed past medical,surgical, social and family history.  Reviewed problem list, medications and allergies. Physical Assessment:   Vitals:   02/20/24 0832  BP: 113/72  Pulse: 87  Weight: 181 lb 3.2 oz (82.2 kg)  Body mass index is 32.1 kg/m.        Physical Examination:   General appearance - well appearing, and in no distress  Mental status - alert, oriented to person, place, and time  Psych:  She has a normal mood and affect  Skin - warm and dry, normal color, no suspicious lesions noted  Chest - effort normal, all lung fields  clear to auscultation bilaterally  Heart - normal rate and regular rhythm  Breasts - breasts appear normal, no suspicious masses, no skin or nipple changes or  axillary nodes, scattered well healed scars on lower breasts due to HS  Abdomen - soft, nontender, nondistended, no masses or organomegaly  Pelvic - deferred  Extremities:  No swelling or varicosities noted  Chaperone present for exam  No results found for this or any previous visit (from the past 24 hours).    Assessment & Plan:  1. Well woman exam with routine gynecological exam (Primary) - Cervical cancer screening: Discussed guidelines. Pap with HPV due 2026-2028 - GC/CT: accepts - Birth Control: none - Breast Health: Encouraged self breast awareness/SBE. Teaching provided.  - F/U 12 months and prn  2. Acute vaginitis Self swab completed, will follow up result.  - Cervicovaginal ancillary only  3. Screening  examination for STI - Cervicovaginal ancillary only - RPR+HBsAg+HCVAb+...   Orders Placed This Encounter  Procedures   RPR+HBsAg+HCVAb+...    Meds: No orders of the defined types were placed in this encounter.   Follow-up: No follow-ups on file.  Carter Quarry, MD 02/20/2024 8:40 AM

## 2024-02-21 ENCOUNTER — Ambulatory Visit: Payer: Self-pay | Admitting: Obstetrics and Gynecology

## 2024-02-21 DIAGNOSIS — B3731 Acute candidiasis of vulva and vagina: Secondary | ICD-10-CM

## 2024-02-21 LAB — RPR+HBSAG+HCVAB+...
HIV Screen 4th Generation wRfx: NONREACTIVE
Hep C Virus Ab: NONREACTIVE
Hepatitis B Surface Ag: NEGATIVE
RPR Ser Ql: NONREACTIVE

## 2024-02-24 LAB — CERVICOVAGINAL ANCILLARY ONLY
Bacterial Vaginitis (gardnerella): NEGATIVE
Candida Glabrata: NEGATIVE
Candida Vaginitis: POSITIVE — AB
Chlamydia: NEGATIVE
Comment: NEGATIVE
Comment: NEGATIVE
Comment: NEGATIVE
Comment: NEGATIVE
Comment: NEGATIVE
Comment: NORMAL
Neisseria Gonorrhea: NEGATIVE
Trichomonas: NEGATIVE

## 2024-02-24 MED ORDER — FLUCONAZOLE 150 MG PO TABS: 150.0000 mg | ORAL_TABLET | Freq: Once | ORAL | 1 refills | Status: AC

## 2024-03-12 ENCOUNTER — Other Ambulatory Visit: Payer: Self-pay | Admitting: Medical Genetics

## 2024-03-12 DIAGNOSIS — Z006 Encounter for examination for normal comparison and control in clinical research program: Secondary | ICD-10-CM
